# Patient Record
Sex: Male | Born: 1965 | Race: White | Hispanic: No | Marital: Single | Smoking: Former smoker
Health system: Southern US, Community
[De-identification: ages and names within clinical notes are randomized; demographics above are authoritative.]

## PROBLEM LIST (undated history)

## (undated) DIAGNOSIS — K760 Fatty (change of) liver, not elsewhere classified: Secondary | ICD-10-CM

## (undated) DIAGNOSIS — I77810 Thoracic aortic ectasia: Secondary | ICD-10-CM

## (undated) DIAGNOSIS — G8929 Other chronic pain: Secondary | ICD-10-CM

## (undated) DIAGNOSIS — M1990 Unspecified osteoarthritis, unspecified site: Secondary | ICD-10-CM

## (undated) DIAGNOSIS — K5792 Diverticulitis of intestine, part unspecified, without perforation or abscess without bleeding: Secondary | ICD-10-CM

## (undated) DIAGNOSIS — I10 Essential (primary) hypertension: Secondary | ICD-10-CM

## (undated) DIAGNOSIS — E119 Type 2 diabetes mellitus without complications: Secondary | ICD-10-CM

## (undated) DIAGNOSIS — J449 Chronic obstructive pulmonary disease, unspecified: Secondary | ICD-10-CM

## (undated) HISTORY — DX: Other chronic pain: G89.29

## (undated) HISTORY — DX: Fatty (change of) liver, not elsewhere classified: K76.0

## (undated) HISTORY — PX: NASAL SINUS SURGERY: SHX719

## (undated) HISTORY — PX: BACK SURGERY: SHX140

## (undated) HISTORY — PX: SHOULDER SURGERY: SHX246

## (undated) HISTORY — DX: Type 2 diabetes mellitus without complications: E11.9

## (undated) HISTORY — DX: Thoracic aortic ectasia: I77.810

## (undated) HISTORY — PX: CHOLECYSTECTOMY: SHX55

## (undated) HISTORY — PX: APPENDECTOMY: SHX54

---

## 2013-02-14 DIAGNOSIS — M1711 Unilateral primary osteoarthritis, right knee: Secondary | ICD-10-CM | POA: Insufficient documentation

## 2013-02-14 DIAGNOSIS — G8929 Other chronic pain: Secondary | ICD-10-CM | POA: Insufficient documentation

## 2013-02-14 DIAGNOSIS — K219 Gastro-esophageal reflux disease without esophagitis: Secondary | ICD-10-CM

## 2013-02-14 HISTORY — DX: Unilateral primary osteoarthritis, right knee: M17.11

## 2013-02-14 HISTORY — DX: Gastro-esophageal reflux disease without esophagitis: K21.9

## 2013-05-02 DIAGNOSIS — Z8249 Family history of ischemic heart disease and other diseases of the circulatory system: Secondary | ICD-10-CM

## 2013-05-02 DIAGNOSIS — I119 Hypertensive heart disease without heart failure: Secondary | ICD-10-CM

## 2013-05-02 DIAGNOSIS — Z87891 Personal history of nicotine dependence: Secondary | ICD-10-CM | POA: Insufficient documentation

## 2013-05-02 HISTORY — DX: Family history of ischemic heart disease and other diseases of the circulatory system: Z82.49

## 2013-05-02 HISTORY — DX: Hypertensive heart disease without heart failure: I11.9

## 2014-01-20 DIAGNOSIS — R0789 Other chest pain: Secondary | ICD-10-CM | POA: Diagnosis present

## 2014-01-20 HISTORY — DX: Other chest pain: R07.89

## 2014-03-05 DIAGNOSIS — K5903 Drug induced constipation: Secondary | ICD-10-CM

## 2014-03-05 HISTORY — DX: Drug induced constipation: K59.03

## 2016-05-25 ENCOUNTER — Encounter: Payer: Self-pay | Admitting: Emergency Medicine

## 2016-05-25 ENCOUNTER — Emergency Department
Admission: EM | Admit: 2016-05-25 | Discharge: 2016-05-26 | Disposition: A | Discharge status: Home / Self Care | Payer: Medicaid Other | Attending: Emergency Medicine | Admitting: Emergency Medicine

## 2016-05-25 DIAGNOSIS — R1032 Left lower quadrant pain: Secondary | ICD-10-CM | POA: Insufficient documentation

## 2016-05-25 DIAGNOSIS — Z9049 Acquired absence of other specified parts of digestive tract: Secondary | ICD-10-CM | POA: Insufficient documentation

## 2016-05-25 DIAGNOSIS — J449 Chronic obstructive pulmonary disease, unspecified: Secondary | ICD-10-CM | POA: Insufficient documentation

## 2016-05-25 DIAGNOSIS — Z87891 Personal history of nicotine dependence: Secondary | ICD-10-CM | POA: Diagnosis not present

## 2016-05-25 DIAGNOSIS — Z5321 Procedure and treatment not carried out due to patient leaving prior to being seen by health care provider: Secondary | ICD-10-CM | POA: Diagnosis not present

## 2016-05-25 DIAGNOSIS — I10 Essential (primary) hypertension: Secondary | ICD-10-CM | POA: Diagnosis not present

## 2016-05-25 HISTORY — DX: Essential (primary) hypertension: I10

## 2016-05-25 HISTORY — DX: Unspecified osteoarthritis, unspecified site: M19.90

## 2016-05-25 HISTORY — DX: Diverticulitis of intestine, part unspecified, without perforation or abscess without bleeding: K57.92

## 2016-05-25 LAB — URINALYSIS COMPLETE WITH MICROSCOPIC (ARMC ONLY)
BACTERIA UA: NONE SEEN
BILIRUBIN URINE: NEGATIVE
GLUCOSE, UA: NEGATIVE mg/dL
Ketones, ur: NEGATIVE mg/dL
Leukocytes, UA: NEGATIVE
NITRITE: NEGATIVE
Protein, ur: NEGATIVE mg/dL
SQUAMOUS EPITHELIAL / LPF: NONE SEEN
Specific Gravity, Urine: 1.024 (ref 1.005–1.030)
pH: 5 (ref 5.0–8.0)

## 2016-05-25 LAB — COMPREHENSIVE METABOLIC PANEL
ALBUMIN: 3.9 g/dL (ref 3.5–5.0)
ALK PHOS: 74 U/L (ref 38–126)
ALT: 13 U/L — ABNORMAL LOW (ref 17–63)
ANION GAP: 8 (ref 5–15)
AST: 20 U/L (ref 15–41)
BUN: 18 mg/dL (ref 6–20)
CALCIUM: 9 mg/dL (ref 8.9–10.3)
CO2: 25 mmol/L (ref 22–32)
Chloride: 101 mmol/L (ref 101–111)
Creatinine, Ser: 0.93 mg/dL (ref 0.61–1.24)
GFR calc Af Amer: >60 mL/min (ref >60)
GFR calc non Af Amer: >60 mL/min (ref >60)
GLUCOSE: 159 mg/dL — AB (ref 65–99)
POTASSIUM: 4.1 mmol/L (ref 3.5–5.1)
SODIUM: 134 mmol/L — AB (ref 135–145)
TOTAL PROTEIN: 7.6 g/dL (ref 6.5–8.1)
Total Bilirubin: 0.5 mg/dL (ref 0.3–1.2)

## 2016-05-25 LAB — CBC
HEMATOCRIT: 47.4 % (ref 40.0–52.0)
HEMOGLOBIN: 15.9 g/dL (ref 13.0–18.0)
MCH: 28 pg (ref 26.0–34.0)
MCHC: 33.5 g/dL (ref 32.0–36.0)
MCV: 83.6 fL (ref 80.0–100.0)
Platelets: 307 10*3/uL (ref 150–440)
RBC: 5.67 MIL/uL (ref 4.40–5.90)
RDW: 15.4 % — AB (ref 11.5–14.5)
WBC: 14.3 10*3/uL — ABNORMAL HIGH (ref 3.8–10.6)

## 2016-05-25 LAB — LIPASE, BLOOD: Lipase: 29 U/L (ref 11–51)

## 2016-05-25 NOTE — ED Notes (Signed)
No answer when called to room

## 2016-05-25 NOTE — ED Triage Notes (Signed)
Pt ambulatory to triage with steady gait with c/o LLQ pain for "more than a month." Pt denies urinary symptoms, reports last BM was this morning. Pt reports nausea and intermittent vomiting. Pt states "when I burp I taste poop." Pt reports gaining 8 lbs from Friday to today, reports unable to eat much food. Pt alert and oriented x 4, no increased work in breathing noted.

## 2016-05-26 ENCOUNTER — Emergency Department: Payer: Medicaid Other

## 2016-05-26 ENCOUNTER — Emergency Department
Admission: EM | Admit: 2016-05-26 | Discharge: 2016-05-26 | Disposition: A | Discharge status: Home / Self Care | Payer: Medicaid Other | Attending: Emergency Medicine | Admitting: Emergency Medicine

## 2016-05-26 ENCOUNTER — Encounter: Payer: Self-pay | Admitting: Emergency Medicine

## 2016-05-26 DIAGNOSIS — F17290 Nicotine dependence, other tobacco product, uncomplicated: Secondary | ICD-10-CM | POA: Diagnosis not present

## 2016-05-26 DIAGNOSIS — R110 Nausea: Secondary | ICD-10-CM | POA: Insufficient documentation

## 2016-05-26 DIAGNOSIS — R1032 Left lower quadrant pain: Secondary | ICD-10-CM | POA: Insufficient documentation

## 2016-05-26 DIAGNOSIS — J449 Chronic obstructive pulmonary disease, unspecified: Secondary | ICD-10-CM | POA: Diagnosis not present

## 2016-05-26 DIAGNOSIS — I10 Essential (primary) hypertension: Secondary | ICD-10-CM | POA: Diagnosis not present

## 2016-05-26 HISTORY — DX: Chronic obstructive pulmonary disease, unspecified: J44.9

## 2016-05-26 MED ORDER — ONDANSETRON HCL 4 MG/2ML IJ SOLN
4.0000 mg | Freq: Once | INTRAMUSCULAR | Status: AC
  Administered 2016-05-26: 4 mg via INTRAVENOUS

## 2016-05-26 MED ORDER — CIPROFLOXACIN HCL 500 MG PO TABS
500.0000 mg | ORAL_TABLET | Freq: Once | ORAL | Status: AC
  Administered 2016-05-26: 500 mg via ORAL
  Filled 2016-05-26: qty 1

## 2016-05-26 MED ORDER — FENTANYL CITRATE (PF) 100 MCG/2ML IJ SOLN
INTRAMUSCULAR | Status: AC
  Administered 2016-05-26: 100 ug via INTRAVENOUS
  Filled 2016-05-26: qty 2

## 2016-05-26 MED ORDER — METRONIDAZOLE 500 MG PO TABS: 500.0000 mg | ORAL_TABLET | Freq: Three times a day (TID) | ORAL | 0 refills | Status: AC

## 2016-05-26 MED ORDER — DICYCLOMINE HCL 10 MG PO CAPS: 10.0000 mg | ORAL_CAPSULE | Freq: Four times a day (QID) | ORAL | 0 refills | Status: DC | PRN

## 2016-05-26 MED ORDER — ONDANSETRON HCL 4 MG/2ML IJ SOLN
INTRAMUSCULAR | Status: AC
  Administered 2016-05-26: 4 mg via INTRAVENOUS
  Filled 2016-05-26: qty 2

## 2016-05-26 MED ORDER — CIPROFLOXACIN HCL 500 MG PO TABS: 500.0000 mg | ORAL_TABLET | Freq: Two times a day (BID) | ORAL | 0 refills | Status: AC

## 2016-05-26 MED ORDER — DICYCLOMINE HCL 10 MG PO CAPS
10.0000 mg | ORAL_CAPSULE | Freq: Once | ORAL | Status: AC
  Administered 2016-05-26: 10 mg via ORAL
  Filled 2016-05-26: qty 1

## 2016-05-26 MED ORDER — FENTANYL CITRATE (PF) 100 MCG/2ML IJ SOLN
100.0000 ug | Freq: Once | INTRAMUSCULAR | Status: AC
  Administered 2016-05-26: 100 ug via INTRAVENOUS

## 2016-05-26 MED ORDER — IOPAMIDOL (ISOVUE-370) INJECTION 76%
100.0000 mL | Freq: Once | INTRAVENOUS | Status: AC | PRN
  Administered 2016-05-26: 100 mL via INTRAVENOUS
  Filled 2016-05-26: qty 100

## 2016-05-26 MED ORDER — SODIUM CHLORIDE 0.9 % IV BOLUS (SEPSIS)
1000.0000 mL | Freq: Once | INTRAVENOUS | Status: AC
  Administered 2016-05-26: 1000 mL via INTRAVENOUS

## 2016-05-26 MED ORDER — DIATRIZOATE MEGLUMINE & SODIUM 66-10 % PO SOLN
15.0000 mL | Freq: Once | ORAL | Status: AC
  Administered 2016-05-26: 15 mL via ORAL

## 2016-05-26 MED ORDER — MORPHINE SULFATE (PF) 4 MG/ML IV SOLN
4.0000 mg | Freq: Once | INTRAVENOUS | Status: DC
  Filled 2016-05-26: qty 1

## 2016-05-26 MED ORDER — METRONIDAZOLE 500 MG PO TABS
500.0000 mg | ORAL_TABLET | Freq: Once | ORAL | Status: AC
  Administered 2016-05-26: 500 mg via ORAL
  Filled 2016-05-26: qty 1

## 2016-05-26 NOTE — ED Notes (Signed)
No answer when called from lobby 

## 2016-05-26 NOTE — Discharge Instructions (Signed)
Please seek medical attention for any high fevers, chest pain, shortness of breath, change in behavior, persistent vomiting, bloody stool or any other new or concerning symptoms.  

## 2016-05-26 NOTE — ED Notes (Signed)
States he developed some left lower abd pain about  Month ago  States pain is daily but changes in intensity    Then noticed some swelling to abd and bothm egs 2 days ago

## 2016-05-26 NOTE — ED Provider Notes (Signed)
[facility] Emergency Department Provider Note    ____________________________________________   I have reviewed the triage vital signs and the nursing notes.   HISTORY  Chief Complaint Abdominal Pain   History limited by: Not Limited   HPI Micheal Bowman is a 50 y.o. male who presents to the emergency department today because of concerns for left lower quadrant abdominal pain. The patient states pain is been there for the past month. It has been fairly constant. He describes it as a ripping sensation. Has progressively gotten worse. It is now severe. He has not noticed any change in defecation although he has started to feel nauseous with eating. Sitting upright makes the pain worse. He denies similar pain in the past. Does have a history of cholecystectomy and appendectomy. He denies any fevers although he feels like he has had chills.    Past Medical History:  Diagnosis Date  . Arthritis   . COPD (chronic obstructive pulmonary disease) (HCC)   . Diverticulitis   . Hypertension     There are no active problems to display for this patient.   Past Surgical History:  Procedure Laterality Date  . APPENDECTOMY    . BACK SURGERY    . CHOLECYSTECTOMY    . SHOULDER SURGERY Right     Prior to Admission medications   Not on File    Allergies Tylenol [acetaminophen]  History reviewed. No pertinent family history.  Social History Social History  Substance Use Topics  . Smoking status: Former Games developermoker  . Smokeless tobacco: Current User    Types: Snuff  . Alcohol use Yes     Comment: occasional    Review of Systems  Constitutional: Negative for fever. Cardiovascular: Negative for chest pain. Respiratory: Negative for shortness of breath. Gastrointestinal: Positive for left lower quadrant pain and nausea.  Neurological: Negative for headaches, focal weakness or numbness.  10-point ROS otherwise  negative.  ____________________________________________   PHYSICAL EXAM:  VITAL SIGNS: ED Triage Vitals [05/26/16 1419]  Enc Vitals Group     BP 117/63     Pulse Rate 87     Resp 20     Temp 98.3 F (36.8 C)     Temp Source Oral     SpO2 95 %     Weight (!) 328 lb (148.8 kg)     Height 6' (1.829 m)     Head Circumference      Peak Flow      Pain Score 10   Constitutional: Alert and oriented. Well appearing and in no distress. Eyes: Conjunctivae are normal. PERRL. Normal extraocular movements. ENT   Head: Normocephalic and atraumatic.   Nose: No congestion/rhinnorhea.   Mouth/Throat: Mucous membranes are moist.   Neck: No stridor. Hematological/Lymphatic/Immunilogical: No cervical lymphadenopathy. Cardiovascular: Normal rate, regular rhythm.  No murmurs, rubs, or gallops. Respiratory: Normal respiratory effort without tachypnea nor retractions. Breath sounds are clear and equal bilaterally. No wheezes/rales/rhonchi. Gastrointestinal: Soft and tender to palpation in the left lower quadrant. Genitourinary: Deferred Musculoskeletal: Normal range of motion in all extremities. No joint effusions.  No lower extremity tenderness nor edema. Neurologic:  Normal speech and language. No gross focal neurologic deficits are appreciated.  Skin:  Skin is warm, dry and intact. No rash noted. Psychiatric: Mood and affect are normal. Speech and behavior are normal. Patient exhibits appropriate insight and judgment.  ____________________________________________    LABS (pertinent positives/negatives)  Labs Reviewed - No data to display   ____________________________________________   EKG  None  ____________________________________________    RADIOLOGY  CT abd/pel IMPRESSION:  1. No acute findings within the abdomen pelvis. No findings to  explain left lower quadrant pain for 1 month.  2. There are colonic diverticula but no evidence of diverticulitis.  3.  Hepatic steatosis.       ____________________________________________   PROCEDURES  Procedures  ____________________________________________   INITIAL IMPRESSION / ASSESSMENT AND PLAN / ED COURSE  Pertinent labs & imaging results that were available during my care of the patient were reviewed by me and considered in my medical decision making (see chart for details).  Patient presents to the emergency department today because of concerns for left lower quadrant pain. On exam he is tender to palpation left lower quadrant. Will obtain CT scan.  Clinical Course   CT scan shows diverticulosis without any acute findings to explain the patient's pain. However given history of pain I would still consider diverticulitis. WIll plan on discharging home with antibiotics and pcp follow up information. ____________________________________________   FINAL CLINICAL IMPRESSION(S) / ED DIAGNOSES  Final diagnoses:  Left lower quadrant pain     Note: This dictation was prepared with Dragon dictation. Any transcriptional errors that result from this process are unintentional    Phineas SemenGraydon Jane Broughton, MD 05/26/16 [phone]

## 2016-05-26 NOTE — ED Triage Notes (Signed)
Pt reports that he has had LLQ pain x 1 month that has gotten worse and worse. Pt states he came yesterday but LWBS due to the wait. Pt states he has constant gas and that he has been bloated in last few days. Pt states that it feels like he "is ripped open." Pt reports reduced appetite. NAD Noted.

## 2016-05-26 NOTE — ED Notes (Signed)
Discharge instructions reviewed with patient. Patient verbalized understanding. Patient ambulated to lobby without difficulty.   

## 2016-07-06 ENCOUNTER — Ambulatory Visit (INDEPENDENT_AMBULATORY_CARE_PROVIDER_SITE_OTHER): Payer: Medicaid Other | Admitting: Neurology

## 2016-07-06 ENCOUNTER — Encounter: Payer: Self-pay | Admitting: Neurology

## 2016-07-06 VITALS — BP 110/72 | HR 69 | Ht 72.0 in | Wt 325.0 lb

## 2016-07-06 DIAGNOSIS — G250 Essential tremor: Secondary | ICD-10-CM

## 2016-07-06 DIAGNOSIS — L989 Disorder of the skin and subcutaneous tissue, unspecified: Secondary | ICD-10-CM | POA: Diagnosis not present

## 2016-07-06 HISTORY — DX: Essential tremor: G25.0

## 2016-07-06 MED ORDER — PRIMIDONE 50 MG PO TABS: ORAL_TABLET | ORAL | 5 refills | Status: DC

## 2016-07-06 NOTE — Patient Instructions (Addendum)
1.  Start primidone 50mg  tablet  - take half-tablet at bedtime for one week, then increase to 1 tablet at bedtime 2.  Call with an update in 1 month 3.  Follow-up with primary care doctor about skin lesion on the leg  Return to clinic 4 months

## 2016-07-06 NOTE — Progress Notes (Signed)
[facility] Neu[facility] Clinic Note - Initial Visit   Date: 07/06/16  Zebulin Siegel MRN: [id number] DOB: [birth year]   Dear Micheal Docker, NP:   Thank you for your kind referral of Micheal Bowman for consultation of tremors. Although tremors history is well known to you, please allow [place] to reiterate it for the purpose of our medical record. The patient was accompanied to the clinic by fiance who also provides collateral information.     History of Present Illness: Micheal Bowman is a 50 y.o. right-handed Caucasian male with hypertension, COPD, GERD, former smoker, s/p posterior lumbar interbody fusion (PLIF) chronic pain, and RLS presenting for evaluation of tremors.    Starting around 2012, he began noticing tremors of the hands which were worse when doing activities.  Symptoms are intermittent, but became more intense and involving greater part of his body over the past few years.  Tremors are most noticeable when he is trying to do tasks. He states that the tremors interfere with his usual activities, for instance, he was placing something on his fiance's motorcycle and it took him 2-days to do, which otherwise would have taken much less time. Tremors are unchanged with alcohol and he does not consume caffeine.  There is no family history of tremor.   He states that his mother head a head tremor at rest and was diagnosed with parkinson's disease.  Patient reports he is walking much slower because of low back pain and knee pain.  There is no stiffness of the arms.    He also complains of a new right lower leg skin lesion which came up two weeks ago.  He denies any bug bites or trauma over the region.  Kathrin Penner feels that it is changing in shape and color.  Out-side paper records, electronic medical record, and images have been reviewed where available and summarized as:  Lab Results  Component Value Date   NA 134 (L) 05/25/2016   K 4.1 05/25/2016   CL 101 05/25/2016   CO2  25 05/25/2016   Lab Results  Component Value Date   WBC 14.3 (H) 05/25/2016   HGB 15.9 05/25/2016   HCT 47.4 05/25/2016   MCV 83.6 05/25/2016   PLT 307 05/25/2016   Lab Results  Component Value Date   ALT 13 (L) 05/25/2016   AST 20 05/25/2016   ALKPHOS 74 05/25/2016   BILITOT 0.5 05/25/2016     Past Medical History:  Diagnosis Date  . Arthritis   . Chronic pain   . COPD (chronic obstructive pulmonary disease) (HCC)   . Diverticulitis   . Fatty liver   . Hypertension     Past Surgical History:  Procedure Laterality Date  . APPENDECTOMY    . BACK SURGERY    . CHOLECYSTECTOMY    . NASAL SINUS SURGERY    . SHOULDER SURGERY Right      Medications:  Outpatient Encounter Prescriptions as of 07/06/2016  Medication Sig Note  . aspirin EC 81 MG tablet Take 81 mg by mouth. 07/06/2016: Received from: [facility] Received Sig: Take 81 mg by mouth daily.  . Cholecalciferol (VITAMIN D3) 5000 units TABS Take by mouth. 07/06/2016: Received from: [facility] Received Sig: Take 5,000 Units by mouth daily.  . citalopram (CELEXA) 20 MG tablet Take 20 mg by mouth daily. 07/06/2016: Received from: External Pharmacy Received Sig: TAKE 1 TABLET BY MOUTH EVERY DAY  . docusate sodium (COLACE) 100 MG capsule Take 100 mg by mouth.  07/06/2016: Received from: [facility] Received Sig: Take 100 mg by mouth every other day.  . fluticasone (FLONASE) 50 MCG/ACT nasal spray Place 2 sprays into both nostrils daily. 07/06/2016: Received from: External Pharmacy Received Sig: INHALE 2 SPRAYS IN EACH NOSTRIL DAILY  . lisinopril-hydrochlorothiazide (PRINZIDE,ZESTORETIC) 10-12.5 MG tablet Take 1 tablet by mouth daily. 07/06/2016: Received from: External Pharmacy Received Sig: TAKE 1 TABLET BY MOUTH EVERY MORNING FOR BLOOD PRESSURE  . meloxicam (MOBIC) 15 MG tablet Take 15 mg by mouth daily. 07/06/2016: Received from: External Pharmacy Received Sig: TAKE 1 TABLET BY MOUTH EVERY DAY  . metoprolol succinate  (TOPROL-XL) 25 MG 24 hr tablet Take 25 mg by mouth daily. 07/06/2016: Received from: External Pharmacy Received Sig: TAKE 1 TABLET BY MOUTH EVERY DAY  . omeprazole (PRILOSEC) 40 MG capsule Take 40 mg by mouth daily. 07/06/2016: Received from: External Pharmacy Received Sig: TAKE 1 CAPSULE BY MOUTH ONCE A DAY  . Oxycodone HCl 10 MG TABS Take 10 mg by mouth 4 (four) times daily. 07/06/2016: Received from: [facility] Received Sig: Take 10 mg by mouth every six (6) hours as needed for pain.  Marland Kitchen rOPINIRole (REQUIP) 1 MG tablet Take 1 mg by mouth at bedtime. 07/06/2016: Received from: External Pharmacy Received Sig: TAKE 1 TABLET BY MOUTH AT BEDTIME  . SPIRIVA RESPIMAT 1.25 MCG/ACT AERS INHALE 2 PUFFS BY MOUTH DAILY 07/06/2016: Received from: External Pharmacy  . tiZANidine (ZANAFLEX) 4 MG tablet Take 1 tablet by mouth daily. 07/06/2016: Received from: External Pharmacy Received Sig: TAKE 1 TABLET BY MOUTH 3 TIMES A DAY AS NEEDED FOR BACK SPASMS  . [DISCONTINUED] dicyclomine (BENTYL) 10 MG capsule Take 1 capsule (10 mg total) by mouth 4 (four) times daily as needed (abdominal pain).    No facility-administered encounter medications on file as of 07/06/2016.      Allergies:  Allergies  Allergen Reactions  . Gabapentin Other (See Comments)    Sour stomach, diarreha  . Hydromorphone Other (See Comments)    headache  . Tylenol [Acetaminophen] Other (See Comments)    "makes my stomach cramp"  . Celecoxib Nausea Only  . Codeine Itching  . Ibuprofen Nausea And Vomiting and Other (See Comments)    SICK ON STOMACH, CRAMPS    Family History: Family History  Problem Relation Age of Onset  . Diabetes Mother   . Heart disease Father   . Cancer Sister   . Cancer Sister     Social History: Social History  Substance Use Topics  . Smoking status: Former Smoker    Quit date: 07/06/2014  . Smokeless tobacco: Current User    Types: Snuff  . Alcohol use Yes     Comment: beer every 3 months   Social  History   Social History Narrative  . No narrative on file    Review of Systems:  CONSTITUTIONAL: No fevers, chills, night sweats, or weight loss.   EYES: No visual changes or eye pain ENT: No hearing changes.  No history of nose bleeds.   RESPIRATORY: No cough, wheezing and shortness of breath.   CARDIOVASCULAR: Negative for chest pain, and palpitations.   GI: Negative for abdominal discomfort, blood in stools or black stools.  No recent change in bowel habits.   GU:  No history of incontinence.   MUSCLOSKELETAL: +history of joint pain or swelling.  No myalgias.   SKIN: Negative for lesions, rash, and itching.   HEMATOLOGY/ONCOLOGY: Negative for prolonged bleeding, bruising easily, and swollen nodes.  No history of cancer.   ENDOCRINE: Negative for cold or heat intolerance, polydipsia or goiter.   PSYCH:  No depression +anxiety symptoms.   NEURO: As Above.   Vital Signs:  BP 110/72   Pulse 69   Ht 6' (1.829 m)   Wt (!) 325 lb (147.4 kg)   BMI 44.08 kg/m   General Medical Exam:   General:  Well appearing, comfortable.   Eyes/ENT: see cranial nerve examination.   Neck: No masses appreciated.  Full range of motion without tenderness.  No carotid bruits. Respiratory:  Clear to auscultation, good air entry bilaterally.   Cardiac:  Regular rate and rhythm, no murmur.   Extremities:  No deformities, edema, or skin discoloration.  Skin:  Dark skin lesion over the right lower leg.  Neurological Exam: MENTAL STATUS including orientation to time, place, person, recent and remote memory, attention span and concentration, language, and fund of knowledge is normal.  Speech is not dysarthric.  CRANIAL NERVES: II:  No visual field defects.  Unremarkable fundi.   III-IV-VI: Pupils equal round and reactive to light.  Normal conjugate, extra-ocular eye movements in all directions of gaze.  No nystagmus.  No ptosis.   V:  Normal facial sensation.     VII:  Normal facial symmetry and  movements.   VIII:  Normal hearing and vestibular function.   IX-X:  Normal palatal movement.   XI:  Normal shoulder shrug and head rotation.   XII:  Normal tongue strength and range of motion, no deviation or fasciculation.  MOTOR:  No atrophy.  There is a low amplitude high frequency hand tremor especially noticeable when finger to nose testing and with arms outstretched.  No tremor at rest. No pronator drift.  Tone is normal.    Right Upper Extremity:    Left Upper Extremity:    Deltoid  5/5   Deltoid  5/5   Biceps  5/5   Biceps  5/5   Triceps  5/5   Triceps  5/5   Wrist extensors  5/5   Wrist extensors  5/5   Wrist flexors  5/5   Wrist flexors  5/5   Finger extensors  5/5   Finger extensors  5/5   Finger flexors  5/5   Finger flexors  5/5   Dorsal interossei  5/5   Dorsal interossei  5/5   Abductor pollicis  5/5   Abductor pollicis  5/5   Tone (Ashworth scale)  0  Tone (Ashworth scale)  0   Right Lower Extremity:    Left Lower Extremity:    Hip flexors  5/5   Hip flexors  5/5   Hip extensors  5/5   Hip extensors  5/5   Knee flexors  5/5   Knee flexors  5/5   Knee extensors  5/5   Knee extensors  5/5   Dorsiflexors  5/5   Dorsiflexors  5/5   Plantarflexors  5/5   Plantarflexors  5/5   Toe extensors  5/5   Toe extensors  5/5   Toe flexors  5/5   Toe flexors  5/5   Tone (Ashworth scale)  0  Tone (Ashworth scale)  0   MSRs:  Right  Left brachioradialis 2+  brachioradialis 2+  biceps 2+  biceps 2+  triceps 2+  triceps 2+  patellar 2+  patellar 2+  ankle jerk 1+  ankle jerk 1+  Hoffman no  Hoffman no  plantar response down  plantar response down   SENSORY:  Reduced vibration and temperature in the lower legs.  Pin prick is intact.   COORDINATION/GAIT:  No dysmetria with finger-to- nose-finger testing, but there is worsening of postural tremor.  Intact rapid alternating movements bilaterally.  Able to rise from a  chair without using arms.  Gait wide-based, slow and antalgic.     IMPRESSION: 1.  Benign essential tremor, no evidence of parkinsonian features on exam  - Start primidone 25mg  at bedtime x 1 week, then increase to 50mg  at bedtime.  Common side effects discussed, including sedation and suicidal ideation.  - With his COPD, prefer to avoid propranolol   - Patient instructed to call with update in 1 month  2.  S/p lumbar fusion and chronic low back pain, followed by PCP. 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  I do not have any imaging to review.   3.  Abnormal skin lesion of the right lower leg  - Recommend he follow-up with PCP  Return to clinic in 4 months.   The duration of this appointment visit was 45 minutes of face-to-face time with the patient.  Greater than 50% of this time was spent in counseling, explanation of diagnosis, planning of further management, and coordination of care.   Thank you for allowing me to participate in patient's care.  If I can answer any additional questions, I would be pleased to do so.    Sincerely,    Lallie Strahm K. Allena Katz, DO

## 2016-07-06 NOTE — Progress Notes (Signed)
Note sent to PCP.  

## 2016-09-02 ENCOUNTER — Telehealth: Payer: Self-pay | Admitting: *Deleted

## 2016-09-02 NOTE — Telephone Encounter (Signed)
Entered in error

## 2016-09-02 NOTE — Telephone Encounter (Signed)
Patient's mother Harriett Sine(Nancy) called stating that patient's tremors are getting bad again like they were before he started medication.  They are wondering if we need to increase medication.  Informed her that I will have to get back to her on Monday and she was fine with that.

## 2016-09-05 NOTE — Telephone Encounter (Signed)
Please instruct him to increase primidone to 75mg  daily (1.5 tab).

## 2016-09-05 NOTE — Telephone Encounter (Signed)
Attempted to call Harriett SineNancy back.  No answer and no voicemail.

## 2016-09-06 ENCOUNTER — Other Ambulatory Visit: Payer: Self-pay | Admitting: *Deleted

## 2016-09-06 MED ORDER — PRIMIDONE 50 MG PO TABS: ORAL_TABLET | ORAL | 5 refills | Status: DC

## 2016-09-06 NOTE — Telephone Encounter (Signed)
Patient's mom given the instructions.

## 2016-09-09 ENCOUNTER — Other Ambulatory Visit: Payer: Self-pay | Admitting: *Deleted

## 2016-11-07 ENCOUNTER — Ambulatory Visit: Payer: Medicaid Other | Admitting: Neurology

## 2016-11-21 ENCOUNTER — Ambulatory Visit: Payer: Medicaid Other | Admitting: Neurology

## 2016-11-30 ENCOUNTER — Encounter: Payer: Self-pay | Admitting: Neurology

## 2017-03-05 ENCOUNTER — Other Ambulatory Visit: Payer: Self-pay | Admitting: Neurology

## 2017-03-23 ENCOUNTER — Encounter: Payer: Self-pay | Admitting: Emergency Medicine

## 2017-03-23 ENCOUNTER — Other Ambulatory Visit: Payer: Self-pay

## 2017-03-23 ENCOUNTER — Observation Stay
Admission: EM | Admit: 2017-03-23 | Discharge: 2017-03-24 | Disposition: A | Discharge status: Home / Self Care | Payer: Medicaid Other | Attending: Internal Medicine | Admitting: Internal Medicine

## 2017-03-23 ENCOUNTER — Emergency Department: Payer: Medicaid Other

## 2017-03-23 DIAGNOSIS — G4730 Sleep apnea, unspecified: Secondary | ICD-10-CM | POA: Insufficient documentation

## 2017-03-23 DIAGNOSIS — Z6841 Body Mass Index (BMI) 40.0 and over, adult: Secondary | ICD-10-CM | POA: Diagnosis not present

## 2017-03-23 DIAGNOSIS — K760 Fatty (change of) liver, not elsewhere classified: Secondary | ICD-10-CM | POA: Insufficient documentation

## 2017-03-23 DIAGNOSIS — Z7982 Long term (current) use of aspirin: Secondary | ICD-10-CM | POA: Insufficient documentation

## 2017-03-23 DIAGNOSIS — G2581 Restless legs syndrome: Secondary | ICD-10-CM | POA: Insufficient documentation

## 2017-03-23 DIAGNOSIS — R079 Chest pain, unspecified: Secondary | ICD-10-CM

## 2017-03-23 DIAGNOSIS — E669 Obesity, unspecified: Secondary | ICD-10-CM | POA: Insufficient documentation

## 2017-03-23 DIAGNOSIS — Z87891 Personal history of nicotine dependence: Secondary | ICD-10-CM | POA: Insufficient documentation

## 2017-03-23 DIAGNOSIS — I119 Hypertensive heart disease without heart failure: Secondary | ICD-10-CM | POA: Diagnosis not present

## 2017-03-23 DIAGNOSIS — R0789 Other chest pain: Secondary | ICD-10-CM | POA: Diagnosis not present

## 2017-03-23 DIAGNOSIS — G8929 Other chronic pain: Secondary | ICD-10-CM | POA: Insufficient documentation

## 2017-03-23 DIAGNOSIS — N400 Enlarged prostate without lower urinary tract symptoms: Secondary | ICD-10-CM | POA: Insufficient documentation

## 2017-03-23 DIAGNOSIS — Z79899 Other long term (current) drug therapy: Secondary | ICD-10-CM | POA: Insufficient documentation

## 2017-03-23 DIAGNOSIS — J449 Chronic obstructive pulmonary disease, unspecified: Secondary | ICD-10-CM | POA: Diagnosis not present

## 2017-03-23 LAB — CBC
HCT: 44.1 % (ref 40.0–52.0)
HEMATOCRIT: 43.8 % (ref 40.0–52.0)
HEMOGLOBIN: 14.4 g/dL (ref 13.0–18.0)
Hemoglobin: 14.5 g/dL (ref 13.0–18.0)
MCH: 27.4 pg (ref 26.0–34.0)
MCH: 27.9 pg (ref 26.0–34.0)
MCHC: 32.9 g/dL (ref 32.0–36.0)
MCHC: 32.8 g/dL (ref 32.0–36.0)
MCV: 83.5 fL (ref 80.0–100.0)
MCV: 85 fL (ref 80.0–100.0)
PLATELETS: 327 10*3/uL (ref 150–440)
Platelets: 293 10*3/uL (ref 150–440)
RBC: 5.28 MIL/uL (ref 4.40–5.90)
RBC: 5.15 MIL/uL (ref 4.40–5.90)
RDW: 15.8 % — AB (ref 11.5–14.5)
RDW: 16.1 % — ABNORMAL HIGH (ref 11.5–14.5)
WBC: 11.2 10*3/uL — ABNORMAL HIGH (ref 3.8–10.6)
WBC: 12.5 10*3/uL — ABNORMAL HIGH (ref 3.8–10.6)

## 2017-03-23 LAB — BASIC METABOLIC PANEL
Anion gap: 5 (ref 5–15)
BUN: 19 mg/dL (ref 6–20)
CALCIUM: 9.1 mg/dL (ref 8.9–10.3)
CHLORIDE: 99 mmol/L — AB (ref 101–111)
CO2: 31 mmol/L (ref 22–32)
CREATININE: 1.04 mg/dL (ref 0.61–1.24)
GFR calc Af Amer: >60 mL/min (ref >60)
GFR calc non Af Amer: >60 mL/min (ref >60)
GLUCOSE: 138 mg/dL — AB (ref 65–99)
Potassium: 4.4 mmol/L (ref 3.5–5.1)
Sodium: 135 mmol/L (ref 135–145)

## 2017-03-23 LAB — TROPONIN I: Troponin I: <0.03 ng/mL (ref <0.03)

## 2017-03-23 LAB — CREATININE, SERUM
Creatinine, Ser: 0.97 mg/dL (ref 0.61–1.24)
GFR calc Af Amer: >60 mL/min (ref >60)
GFR calc non Af Amer: >60 mL/min (ref >60)

## 2017-03-23 MED ORDER — QUETIAPINE FUMARATE 200 MG PO TABS
200.0000 mg | ORAL_TABLET | Freq: Every day | ORAL | Status: DC
  Administered 2017-03-23: 200 mg via ORAL
  Filled 2017-03-23 (×2): qty 1

## 2017-03-23 MED ORDER — ROPINIROLE HCL 1 MG PO TABS
1.0000 mg | ORAL_TABLET | Freq: Every day | ORAL | Status: DC
  Administered 2017-03-23: 1 mg via ORAL
  Filled 2017-03-23: qty 1

## 2017-03-23 MED ORDER — TRAZODONE HCL 100 MG PO TABS
100.0000 mg | ORAL_TABLET | Freq: Every evening | ORAL | Status: DC | PRN
  Administered 2017-03-23: 100 mg via ORAL
  Filled 2017-03-23: qty 1

## 2017-03-23 MED ORDER — NITROGLYCERIN 2 % TD OINT
1.0000 [in_us] | TOPICAL_OINTMENT | Freq: Once | TRANSDERMAL | Status: AC
  Administered 2017-03-23: 1 [in_us] via TOPICAL
  Filled 2017-03-23: qty 1

## 2017-03-23 MED ORDER — PANTOPRAZOLE SODIUM 40 MG PO TBEC
40.0000 mg | DELAYED_RELEASE_TABLET | Freq: Every day | ORAL | Status: DC
  Administered 2017-03-23 – 2017-03-24 (×2): 40 mg via ORAL
  Filled 2017-03-23 (×2): qty 1

## 2017-03-23 MED ORDER — HEPARIN SODIUM (PORCINE) 5000 UNIT/ML IJ SOLN
5000.0000 [IU] | Freq: Three times a day (TID) | INTRAMUSCULAR | Status: DC
  Administered 2017-03-23 – 2017-03-24 (×2): 5000 [IU] via SUBCUTANEOUS
  Filled 2017-03-23 (×2): qty 1

## 2017-03-23 MED ORDER — ACETAMINOPHEN 650 MG RE SUPP: 650.0000 mg | Freq: Four times a day (QID) | RECTAL | Status: DC | PRN

## 2017-03-23 MED ORDER — ONDANSETRON HCL 4 MG/2ML IJ SOLN
INTRAMUSCULAR | Status: AC
  Administered 2017-03-23: 4 mg via INTRAVENOUS
  Filled 2017-03-23: qty 2

## 2017-03-23 MED ORDER — ASPIRIN 81 MG PO CHEW
243.0000 mg | CHEWABLE_TABLET | Freq: Once | ORAL | Status: AC
  Administered 2017-03-23: 243 mg via ORAL
  Filled 2017-03-23: qty 3

## 2017-03-23 MED ORDER — LISINOPRIL 10 MG PO TABS
10.0000 mg | ORAL_TABLET | Freq: Every day | ORAL | Status: DC
  Administered 2017-03-24: 10 mg via ORAL
  Filled 2017-03-23: qty 1

## 2017-03-23 MED ORDER — ONDANSETRON HCL 4 MG/2ML IJ SOLN: 4.0000 mg | Freq: Four times a day (QID) | INTRAMUSCULAR | Status: DC | PRN

## 2017-03-23 MED ORDER — ASPIRIN EC 81 MG PO TBEC
81.0000 mg | DELAYED_RELEASE_TABLET | Freq: Every day | ORAL | Status: DC
  Administered 2017-03-24: 81 mg via ORAL
  Filled 2017-03-23: qty 1

## 2017-03-23 MED ORDER — CITALOPRAM HYDROBROMIDE 20 MG PO TABS
40.0000 mg | ORAL_TABLET | Freq: Every day | ORAL | Status: DC
  Administered 2017-03-23 – 2017-03-24 (×2): 40 mg via ORAL
  Filled 2017-03-23 (×2): qty 2

## 2017-03-23 MED ORDER — NITROGLYCERIN 0.4 MG SL SUBL: 0.4000 mg | SUBLINGUAL_TABLET | SUBLINGUAL | Status: DC | PRN

## 2017-03-23 MED ORDER — HYDROCHLOROTHIAZIDE 12.5 MG PO CAPS
12.5000 mg | ORAL_CAPSULE | Freq: Every day | ORAL | Status: DC
  Administered 2017-03-24: 12.5 mg via ORAL
  Filled 2017-03-23: qty 1

## 2017-03-23 MED ORDER — OXYCODONE HCL 5 MG PO TABS
10.0000 mg | ORAL_TABLET | Freq: Four times a day (QID) | ORAL | Status: DC | PRN
  Administered 2017-03-23 – 2017-03-24 (×3): 10 mg via ORAL
  Filled 2017-03-23 (×3): qty 2

## 2017-03-23 MED ORDER — MORPHINE SULFATE (PF) 4 MG/ML IV SOLN
INTRAVENOUS | Status: AC
  Administered 2017-03-23: 4 mg via INTRAVENOUS
  Filled 2017-03-23: qty 1

## 2017-03-23 MED ORDER — ONDANSETRON HCL 4 MG/2ML IJ SOLN
4.0000 mg | Freq: Once | INTRAMUSCULAR | Status: AC
  Administered 2017-03-23: 4 mg via INTRAVENOUS

## 2017-03-23 MED ORDER — ACETAMINOPHEN 325 MG PO TABS: 650.0000 mg | ORAL_TABLET | Freq: Four times a day (QID) | ORAL | Status: DC | PRN

## 2017-03-23 MED ORDER — METOPROLOL SUCCINATE ER 25 MG PO TB24
25.0000 mg | ORAL_TABLET | Freq: Every day | ORAL | Status: DC
  Administered 2017-03-23 – 2017-03-24 (×2): 25 mg via ORAL
  Filled 2017-03-23 (×2): qty 1

## 2017-03-23 MED ORDER — NITROGLYCERIN 0.4 MG SL SUBL
0.4000 mg | SUBLINGUAL_TABLET | SUBLINGUAL | Status: DC | PRN
  Administered 2017-03-23 (×2): 0.4 mg via SUBLINGUAL
  Filled 2017-03-23: qty 1

## 2017-03-23 MED ORDER — IOPAMIDOL (ISOVUE-370) INJECTION 76%
125.0000 mL | Freq: Once | INTRAVENOUS | Status: AC | PRN
  Administered 2017-03-23: 125 mL via INTRAVENOUS

## 2017-03-23 MED ORDER — ONDANSETRON HCL 4 MG PO TABS: 4.0000 mg | ORAL_TABLET | Freq: Four times a day (QID) | ORAL | Status: DC | PRN

## 2017-03-23 MED ORDER — FLUTICASONE PROPIONATE 50 MCG/ACT NA SUSP
2.0000 | Freq: Every day | NASAL | Status: DC
  Administered 2017-03-23: 2 via NASAL
  Filled 2017-03-23: qty 16

## 2017-03-23 MED ORDER — MORPHINE SULFATE (PF) 4 MG/ML IV SOLN
4.0000 mg | Freq: Once | INTRAVENOUS | Status: AC
Start: 2017-03-23 — End: 2017-03-23
  Administered 2017-03-23: 4 mg via INTRAVENOUS

## 2017-03-23 MED ORDER — DOCUSATE SODIUM 100 MG PO CAPS
100.0000 mg | ORAL_CAPSULE | ORAL | Status: DC
  Administered 2017-03-23: 100 mg via ORAL
  Filled 2017-03-23 (×2): qty 1

## 2017-03-23 MED ORDER — LISINOPRIL-HYDROCHLOROTHIAZIDE 10-12.5 MG PO TABS: 1.0000 | ORAL_TABLET | Freq: Every day | ORAL | Status: DC

## 2017-03-23 MED ORDER — TAMSULOSIN HCL 0.4 MG PO CAPS
0.4000 mg | ORAL_CAPSULE | Freq: Every day | ORAL | Status: DC
  Administered 2017-03-23 – 2017-03-24 (×2): 0.4 mg via ORAL
  Filled 2017-03-23 (×2): qty 1

## 2017-03-23 MED ORDER — MELOXICAM 7.5 MG PO TABS
15.0000 mg | ORAL_TABLET | Freq: Every day | ORAL | Status: DC
  Administered 2017-03-23 – 2017-03-24 (×2): 15 mg via ORAL
  Filled 2017-03-23 (×2): qty 2

## 2017-03-23 NOTE — ED Notes (Signed)
Pt called out and reports chest pain is severe,  Pt clutching left side of chest, second EKG done, dr Shaune Pollacklord notified.  Pt states chest pain lasted for about 3 minutes and then resolved on its own.

## 2017-03-23 NOTE — ED Provider Notes (Signed)
[facility] Emergency Department Provider Note ____________________________________________   I have reviewed the triage vital signs and the triage nursing note.  HISTORY  Chief Complaint Chest Pain   Historian Patient and his wife  HPI Jennings BooksJohnnie Obremski is a 51 y.o. male with a history of chronic right shoulder pain for which she takes narcotic pain medication, as well as COPD and hypertension, presents for chest discomfort that started while he was changing a battery for his wife's car on Sunday. It is located in the left anterior chest and it feels deep. It's been there essentially constantly but is worse with coughing. At times he feels like his heart is skipping a beat. He feels some pressure there. No nausea or vomiting. No extension up into the neck or jaw or arm. No known history of coronary disease. He takes a baby aspirin daily.  Does not follow with a cardiologist. Symptoms are moderate. His wife was finally able to convince him to come get some evaluation. She gave him a nitroglycerin Sunday which helped somewhat.    Past Medical History:  Diagnosis Date  . Arthritis   . Chronic pain   . COPD (chronic obstructive pulmonary disease) (HCC)   . Diverticulitis   . Fatty liver   . Hypertension     Patient Active Problem List   Diagnosis Date Noted  . Essential tremor 07/06/2016    Past Surgical History:  Procedure Laterality Date  . APPENDECTOMY    . BACK SURGERY    . CHOLECYSTECTOMY    . NASAL SINUS SURGERY    . SHOULDER SURGERY Right     Prior to Admission medications   Medication Sig Start Date End Date Taking? Authorizing Provider  aspirin EC 81 MG tablet Take 81 mg by mouth.   Yes [provider]  Cholecalciferol (VITAMIN D3) 5000 units TABS Take by mouth.   Yes [provider]  citalopram (CELEXA) 40 MG tablet Take 40 mg by mouth daily. 03/05/17  Yes [provider]  docusate sodium (COLACE) 100 MG capsule  Take 100 mg by mouth every other day.    Yes [provider]  fluticasone (FLONASE) 50 MCG/ACT nasal spray Place 2 sprays into both nostrils daily. 05/03/16  Yes [provider]  lisinopril-hydrochlorothiazide (PRINZIDE,ZESTORETIC) 10-12.5 MG tablet Take 1 tablet by mouth daily. 06/17/16  Yes [provider]  meloxicam (MOBIC) 15 MG tablet Take 15 mg by mouth daily. 06/17/16  Yes [provider]  metoprolol succinate (TOPROL-XL) 25 MG 24 hr tablet Take 25 mg by mouth daily. 06/17/16  Yes [provider]  omeprazole (PRILOSEC) 40 MG capsule Take 40 mg by mouth daily. 06/17/16  Yes [provider]  Oxycodone HCl 10 MG TABS Take 10 mg by mouth every 6 (six) hours as needed (PAIN).    Yes [provider]  QUEtiapine (SEROQUEL) 100 MG tablet Take 200 mg by mouth at bedtime.   Yes [provider]  rOPINIRole (REQUIP) 1 MG tablet Take 1 mg by mouth at bedtime. 06/17/16  Yes [provider]  SPIRIVA RESPIMAT 1.25 MCG/ACT AERS INHALE 2 PUFFS BY MOUTH DAILY 05/20/16  Yes [provider]  tamsulosin (FLOMAX) 0.4 MG CAPS capsule Take 0.4 mg by mouth daily. 03/07/17  Yes [provider]  traZODone (DESYREL) 100 MG tablet Take 100 mg by mouth at bedtime as needed for sleep. 03/06/17  Yes [provider]  primidone (MYSOLINE) 50 MG tablet Take 1 and 1/2 tablets at bedtime. (  75 mg total) Patient not taking: Reported on 03/23/2017 09/06/16   Nita Sickle K, DO    Allergies  Allergen Reactions  . Gabapentin Other (See Comments)    Sour stomach, diarreha  . Hydromorphone Other (See Comments)    headache  . Tylenol [Acetaminophen] Other (See Comments)    "makes my stomach cramp"  . Celecoxib Nausea Only  . Codeine Itching  . Ibuprofen Nausea And Vomiting and Other (See Comments)    SICK ON STOMACH, CRAMPS    Family History  Problem Relation Age of Onset  . Diabetes Mother   . Heart disease Father   . Cancer  Sister   . Cancer Sister     Social History Social History  Substance Use Topics  . Smoking status: Former Smoker    Quit date: 07/06/2014  . Smokeless tobacco: Current User    Types: Snuff  . Alcohol use Yes     Comment: beer every 3 months    Review of Systems  Constitutional: Negative for fever. Eyes: Negative for visual changes. ENT: Negative for sore throat. Cardiovascular: Positive for chest pain. Respiratory: Mild occasional cough. Gastrointestinal: Negative for abdominal pain, vomiting and diarrhea.  Denies burping or belching. Genitourinary: Negative for dysuria. Musculoskeletal: Negative for back pain. Skin: Negative for rash. Neurological: Negative for headache.  ____________________________________________   PHYSICAL EXAM:  VITAL SIGNS: ED Triage Vitals [03/23/17 0959]  Enc Vitals Group     BP 119/64     Pulse Rate 86     Resp 18     Temp 98.3 F (36.8 C)     Temp Source Oral     SpO2 94 %     Weight (!) 347 lb (157.4 kg)     Height 6' (1.829 m)     Head Circumference      Peak Flow      Pain Score 5     Pain Loc      Pain Edu?      Excl. in GC?      Constitutional: Alert and oriented. Well appearing and in no distress. HEENT   Head: Normocephalic and atraumatic.      Eyes: Conjunctivae are normal. Pupils equal and round.       Ears:         Nose: No congestion/rhinnorhea.   Mouth/Throat: Mucous membranes are moist.   Neck: No stridor. Cardiovascular/Chest: Normal rate, regular rhythm.  No murmurs, rubs, or gallops. Respiratory: Normal respiratory effort without tachypnea nor retractions. Breath sounds are clear and equal bilaterally. No wheezes/rales/rhonchi. Gastrointestinal: Soft. No distention, no guarding, no rebound. Nontender.   Genitourinary/rectal:Deferred Musculoskeletal: Nontender with normal range of motion in all extremities. No joint effusions.  No lower extremity tenderness.  No edema. Neurologic:  Normal speech and  language. No gross or focal neurologic deficits are appreciated. Skin:  Skin is warm, dry and intact. No rash noted. Psychiatric: Mood and affect are normal. Speech and behavior are normal. Patient exhibits appropriate insight and judgment.   ____________________________________________  LABS (pertinent positives/negatives)  Labs Reviewed  BASIC METABOLIC PANEL - Abnormal; Notable for the following:       Result Value   Chloride 99 (*)    Glucose, Bld 138 (*)    All other components within normal limits  CBC - Abnormal; Notable for the following:    WBC 11.2 (*)    RDW 15.8 (*)    All other components within normal limits  TROPONIN I  TROPONIN I  ____________________________________________    EKG I, Governor Rooks, MD, the attending physician have personally viewed and interpreted all ECGs.  82 bpm. Normal sinus rhythm. Narrow QRS. Normal axis. Normal ST and T-wave  EKG #2 84 bpm. Normal sinus rhythm. Narrow QRS normal axis. T waves inverted in 3 and aVF which didn't change from earlier EKG. ____________________________________________  RADIOLOGY All Xrays were viewed by me. Imaging interpreted by Radiologist.  Chest x-ray two-view: IMPRESSION: 1.  Borderline cardiomegaly.  No pulmonary venous congestion.  2. No acute pulmonary disease.  CT anterior chest abdomen pelvis:  IMPRESSION: No evidence of thoracic or abdominal aortic aneurysm or dissection.  Fatty infiltration of the liver.  No other significant abnormality seen in the chest, abdomen or pelvis __________________________________________  PROCEDURES  Procedure(s) performed: None  Critical Care performed: None  ____________________________________________   ED COURSE / ASSESSMENT AND PLAN  Pertinent labs & imaging results that were available during my care of the patient were reviewed by me and considered in my medical decision making (see chart for details).   Mr. Dahir is here for  left-sided chest discomfort which is pressure and somewhat exertional and given his body habitus being obese with hypertension, I'm somewhat concerned about cardiac etiology. His EKG is really normal in appearance. His initial troponin is negative. His chest x-ray was reassuring. He still complaining of about 7 out of 10 discomfort and I am going to CT his chest to evaluate the aorta.  When the tried nitroglycerin. Urine had baby aspirin, I will hold off on additional aspirin until CT is performed.  He denies symptoms of trauma, overuse, GI, or stress.  I discussed with him that although workup is reassuring evaluation in the ED, we could either admit him to the hospital overnight for cardiology workup and evaluation and consider stress test, but patient would really rather go home. I spoke with on-call cardiologist Dr. Juliann Pares, who is in agreement to see him next day early in the morning.  I was called to patient's bedside because he was having eye new/worsening chest discomfort. Repeat EKG was obtained and although episode had already subsided somewhat, T waves were now inverted in 3 and aVF. I am going to go ahead and admit him given concern for ongoing chest discomfort with change in EKG and he is somewhat concerning to me. Patient at this point was given 3 additional baby aspirin because he took one this morning already. He was given nitroglycerin paste.  I discussed with hospitalist for admission.   CONSULTATIONS:   Hospitalist for admission.  Initially spoke with cardiology.   Patient / Family / Caregiver informed of clinical course, medical decision-making process, and agree with plan.  ___________________________________________   FINAL CLINICAL IMPRESSION(S) / ED DIAGNOSES   Final diagnoses:  Nonspecific chest pain              Note: This dictation was prepared with Dragon dictation. Any transcriptional errors that result from this process are unintentional     Governor Rooks, MD 03/23/17 1529

## 2017-03-23 NOTE — ED Triage Notes (Signed)
Pt reports left chest pain that started on Sunday, states he took wife's NTG and had relief, states today the pain is not getting any better and states "it feels like a car resting on my chest"  Pt is short of breath in triage. C/o chest pain radiating down left arm.

## 2017-03-23 NOTE — Plan of Care (Signed)
Problem: Pain Managment: Goal: General experience of comfort will improve Outcome: Not Progressing Patient continue to complain of chest pain despite receiving nitrates.

## 2017-03-23 NOTE — ED Notes (Signed)
Pt to ed with c/o chest pain that started over the weekend, pt reports he took his wifes nitro with relief.  Reports pain began again today and has continued.  Radiates to left arm, pt also reports sob with activity, lungs clear bilat.  Pt alert and oriented on CM, skin warm and dry.

## 2017-03-23 NOTE — ED Notes (Signed)
Dr Shaune Pollacklord at bedside at this time.

## 2017-03-23 NOTE — H&P (Signed)
[employer] [facility] [place] [facility]   PATIENT NAME: Micheal Bowman    MR#:  [id number]  DATE OF BIRTH:  10-28-1965  DATE OF ADMISSION:  03/23/2017  PRIMARY CARE PHYSICIAN: [facility]   REQUESTING/REFERRING PHYSICIAN: Dr. Governor Rooks  CHIEF COMPLAINT:   Chief Complaint  Patient presents with  . Chest Pain    HISTORY OF PRESENT ILLNESS:  Micheal Bowman  is a 51 y.o. male with a known history of COPD with ongoing tobacco abuse, hypertension, osteoarthritis, chronic pain, restless leg syndrome, BPH who presents to the hospital complaining of chest pain. Patient describes his chest pain located in the center of his chest radiating to his left arm associated with some shortness of breath nausea, dizziness but no true syncope. Patient says he's had chest pain now for the past week but it has progressively gotten worse. He was concerned and therefore came to the ER for further evaluation. Patient underwent a CT scan of the chest contrast which showed no evidence of pulmonary emboli. Patient does have significant risk factors for coronary artery disease and therefore hospitalist services were called for treatment and evaluation.  PAST MEDICAL HISTORY:   Past Medical History:  Diagnosis Date  . Arthritis   . Chronic pain   . COPD (chronic obstructive pulmonary disease) (HCC)   . Diverticulitis   . Fatty liver   . Hypertension     PAST SURGICAL HISTORY:   Past Surgical History:  Procedure Laterality Date  . APPENDECTOMY    . BACK SURGERY    . CHOLECYSTECTOMY    . NASAL SINUS SURGERY    . SHOULDER SURGERY Right     SOCIAL HISTORY:   Social History  Substance Use Topics  . Smoking status: Former Smoker    Years: 35.00    Types: Cigars    Quit date: 07/06/2014  . Smokeless tobacco: Current User    Types: Snuff  . Alcohol use Yes     Comment: beer every 3 months    FAMILY HISTORY:   Family History  Problem Relation  Age of Onset  . Diabetes Mother   . Heart disease Father   . Cancer Sister   . Cancer Sister     DRUG ALLERGIES:   Allergies  Allergen Reactions  . Gabapentin Other (See Comments)    Sour stomach, diarreha  . Hydromorphone Other (See Comments)    headache  . Tylenol [Acetaminophen] Other (See Comments)    "makes my stomach cramp"  . Celecoxib Nausea Only  . Codeine Itching  . Ibuprofen Nausea And Vomiting and Other (See Comments)    SICK ON STOMACH, CRAMPS    REVIEW OF SYSTEMS:   Review of Systems  Constitutional: Negative for fever and weight loss.  HENT: Negative for congestion, nosebleeds and tinnitus.   Eyes: Negative for blurred vision, double vision and redness.  Respiratory: Negative for cough, hemoptysis and shortness of breath.   Cardiovascular: Positive for chest pain. Negative for orthopnea, leg swelling and PND.  Gastrointestinal: Negative for abdominal pain, diarrhea, melena, nausea and vomiting.  Genitourinary: Negative for dysuria, hematuria and urgency.  Musculoskeletal: Negative for falls and joint pain.  Neurological: Negative for dizziness, tingling, sensory change, focal weakness, seizures, weakness and headaches.  Endo/Heme/Allergies: Negative for polydipsia. Does not bruise/bleed easily.  Psychiatric/Behavioral: Negative for depression and memory loss. The patient is not nervous/anxious.     MEDICATIONS AT HOME:   Prior to Admission medications   Medication  Sig Start Date End Date Taking? Authorizing Provider  aspirin EC 81 MG tablet Take 81 mg by mouth.   Yes [provider]  Cholecalciferol (VITAMIN D3) 5000 units TABS Take by mouth.   Yes [provider]  citalopram (CELEXA) 40 MG tablet Take 40 mg by mouth daily. 03/05/17  Yes [provider]  docusate sodium (COLACE) 100 MG capsule Take 100 mg by mouth every other day.    Yes [provider]  fluticasone (FLONASE) 50 MCG/ACT nasal spray Place 2 sprays into  both nostrils daily. 05/03/16  Yes [provider]  lisinopril-hydrochlorothiazide (PRINZIDE,ZESTORETIC) 10-12.5 MG tablet Take 1 tablet by mouth daily. 06/17/16  Yes [provider]  meloxicam (MOBIC) 15 MG tablet Take 15 mg by mouth daily. 06/17/16  Yes [provider]  metoprolol succinate (TOPROL-XL) 25 MG 24 hr tablet Take 25 mg by mouth daily. 06/17/16  Yes [provider]  omeprazole (PRILOSEC) 40 MG capsule Take 40 mg by mouth daily. 06/17/16  Yes [provider]  Oxycodone HCl 10 MG TABS Take 10 mg by mouth every 6 (six) hours as needed (PAIN).    Yes [provider]  QUEtiapine (SEROQUEL) 100 MG tablet Take 200 mg by mouth at bedtime.   Yes [provider]  rOPINIRole (REQUIP) 1 MG tablet Take 1 mg by mouth at bedtime. 06/17/16  Yes [provider]  SPIRIVA RESPIMAT 1.25 MCG/ACT AERS INHALE 2 PUFFS BY MOUTH DAILY 05/20/16  Yes [provider]  tamsulosin (FLOMAX) 0.4 MG CAPS capsule Take 0.4 mg by mouth daily. 03/07/17  Yes [provider]  traZODone (DESYREL) 100 MG tablet Take 100 mg by mouth at bedtime as needed for sleep. 03/06/17  Yes [provider]  primidone (MYSOLINE) 50 MG tablet Take 1 and 1/2 tablets at bedtime. (75 mg total) Patient not taking: Reported on 03/23/2017 09/06/16   Nita SicklePatel, Donika K, DO      VITAL SIGNS:  Blood pressure 121/70, pulse 87, temperature 98.3 F (36.8 C), temperature source Oral, resp. rate 15, height 6' (1.829 m), weight (!) 157.4 kg (347 lb), SpO2 97 %.  PHYSICAL EXAMINATION:  Physical Exam  GENERAL:  51 y.o.-year-old obese patient lying in the bed in no acute distress.  EYES: Pupils equal, round, reactive to light and accommodation. No scleral icterus. Extraocular muscles intact.  HEENT: Head atraumatic, normocephalic. Oropharynx and nasopharynx clear. No oropharyngeal erythema, moist oral mucosa  NECK:  Supple, no jugular venous distention. No thyroid  enlargement, no tenderness.  LUNGS: Normal breath sounds bilaterally, no wheezing, rales, rhonchi. No use of accessory muscles of respiration.  CARDIOVASCULAR: S1, S2 RRR. No murmurs, rubs, gallops, clicks.  ABDOMEN: Soft, nontender, nondistended. Bowel sounds present. No organomegaly or mass.  EXTREMITIES: No pedal edema, cyanosis, or clubbing. + 2 pedal & radial pulses b/l.   NEUROLOGIC: Cranial nerves II through XII are intact. No focal Motor or sensory deficits appreciated b/l. PSYCHIATRIC: The patient is alert and oriented x 3. SKIN: No obvious rash, lesion, or ulcer.   LABORATORY PANEL:   CBC  Recent Labs Lab 03/23/17 0958  WBC 11.2*  HGB 14.5  HCT 44.1  PLT 327   ------------------------------------------------------------------------------------------------------------------  Chemistries   Recent Labs Lab 03/23/17 0958  NA 135  K 4.4  CL 99*  CO2 31  GLUCOSE 138*  BUN 19  CREATININE 1.04  CALCIUM 9.1   ------------------------------------------------------------------------------------------------------------------  Cardiac Enzymes  Recent Labs Lab 03/23/17 1304  TROPONINI <0.03   ------------------------------------------------------------------------------------------------------------------  RADIOLOGY:  Dg Chest 2 View  Result Date: 03/23/2017 CLINICAL DATA:  Chest pain . EXAM: CHEST  2 VIEW COMPARISON:  CT 05/26/2016. FINDINGS: Mediastinum and hilar structures normal. Lungs are clear. Borderline cardiomegaly. No pleural effusion or pneumothorax. No acute bony abnormality. Degenerative changes thoracic spine IMPRESSION: 1.  Borderline cardiomegaly.  No pulmonary venous congestion. 2. No acute pulmonary disease. Electronically Signed   By: Maisie Fus  Register   On: 03/23/2017 10:21   Ct Angio Chest/abd/pel For Dissection W And/or Wo Contrast  Result Date: 03/23/2017 CLINICAL DATA:  Left-sided chest pain. EXAM: CT ANGIOGRAPHY CHEST, ABDOMEN AND PELVIS  TECHNIQUE: Multidetector CT imaging through the chest, abdomen and pelvis was performed using the standard protocol during bolus administration of intravenous contrast. Multiplanar reconstructed images and MIPs were obtained and reviewed to evaluate the vascular anatomy. CONTRAST:  125 mL of Isovue 370 intravenously. COMPARISON:  CT scan of May 26, 2016. FINDINGS: CTA CHEST FINDINGS Cardiovascular: Preferential opacification of the thoracic aorta. No evidence of thoracic aortic aneurysm or dissection. Normal heart size. No pericardial effusion. Mediastinum/Nodes: No enlarged mediastinal, hilar, or axillary lymph nodes. Thyroid gland, trachea, and esophagus demonstrate no significant findings. Lungs/Pleura: Lungs are clear. No pleural effusion or pneumothorax. Musculoskeletal: No chest wall abnormality. No acute or significant osseous findings. Review of the MIP images confirms the above findings. CTA ABDOMEN AND PELVIS FINDINGS VASCULAR Aorta: Normal caliber aorta without aneurysm, dissection, vasculitis or significant stenosis. Celiac: Patent without evidence of aneurysm, dissection, vasculitis or significant stenosis. SMA: Patent without evidence of aneurysm, dissection, vasculitis or significant stenosis. Renals: Both renal arteries are patent without evidence of aneurysm, dissection, vasculitis, fibromuscular dysplasia or significant stenosis. IMA: Patent without evidence of aneurysm, dissection, vasculitis or significant stenosis. Inflow: Patent without evidence of aneurysm, dissection, vasculitis or significant stenosis. Veins: No obvious venous abnormality within the limitations of this arterial phase study. Review of the MIP images confirms the above findings. NON-VASCULAR Hepatobiliary: Fatty infiltration of the liver is noted. Status post cholecystectomy. Pancreas: Unremarkable. No pancreatic ductal dilatation or surrounding inflammatory changes. Spleen: Normal in size without focal abnormality.  Adrenals/Urinary Tract: Adrenal glands are unremarkable. Kidneys are normal, without renal calculi, focal lesion, or hydronephrosis. Bladder is unremarkable. Stomach/Bowel: The stomach appears normal. There is no evidence of bowel obstruction or ileus. Status post appendectomy. Lymphatic: No adenopathy is noted. Reproductive: Prostate is unremarkable. Other: No abdominal wall hernia or abnormality. No abdominopelvic ascites. Musculoskeletal: Status post surgical posterior fusion of lower lumbar spine. No acute abnormality is noted. Review of the MIP images confirms the above findings. IMPRESSION: No evidence of thoracic or abdominal aortic aneurysm or dissection. Fatty infiltration of the liver. No other significant abnormality seen in the chest, abdomen or pelvis. Electronically Signed   By: Lupita Raider, M.D.   On: 03/23/2017 14:32     IMPRESSION AND PLAN:   51 year old male with past medical history of hypertension, COPD, osteoarthritis, chronic pain, history of sleep apnea presented to the hospital due to chest pain.  1. Chest pain-patient's symptoms are very atypical for angina but he does have significant risk factors given his obesity, tobacco abuse, and significant family history. -Patient has had a negative cardiac catheterization at [facility] in 2015.  -We'll observe on telemetry, cycle his cardiac markers, his EKG shows no acute ST or T-wave changes. -I'll get a nuclear medicine stress test in the morning. -Continue aspirin, nitroglycerin, beta blocker  2. History of COPD-no acute exacerbation. We'll place on DuoNeb nebs as needed.  3.  GERD-continue Protonix.  4. Essential hypertension-continue lisinopril/HCTZ, metoprolol.  5. Chronic pain-continue oxycodone as needed.  6. Restless leg syndrome-continue Requip.  7. BPH-continue Flomax.  8. Osteoarthritis -  continue meloxicam.  9. Depression-continue Celexa.    All the records are reviewed and case discussed with ED  provider. Management plans discussed with the patient, family and they are in agreement.  CODE STATUS: Full code  TOTAL TIME TAKING CARE OF THIS PATIENT: 40 minutes.    Houston Siren M.D on 03/23/2017 at 4:39 PM  Between 7am to 6pm - Pager - [phone]  After 6pm go to [email or web address] - password [email or web address] [facility]  [place] [facility]  Office  [phone]  CC: Primary care physician; [facility]

## 2017-03-23 NOTE — ED Notes (Signed)
Pt states he has a headache since placing nitro on skin.  Pt also reports that chest pain has been unrelieved from nitro paste.  Spoke with dr Quentin Cornwallsanani and he states to remove paste and offer pt tylenol for headache,  Pt states " just take the paste off"  Pt refused tylenol for headache.  Awaiting admission orders at this time.

## 2017-03-24 ENCOUNTER — Observation Stay (HOSPITAL_BASED_OUTPATIENT_CLINIC_OR_DEPARTMENT_OTHER): Payer: Medicaid Other

## 2017-03-24 DIAGNOSIS — R079 Chest pain, unspecified: Secondary | ICD-10-CM | POA: Diagnosis not present

## 2017-03-24 LAB — NM MYOCAR MULTI W/SPECT W/WALL MOTION / EF
CSEPEDS: 0 s
CSEPHR: 66 %
CSEPPHR: 113 {beats}/min
Estimated workload: 1 METS
Exercise duration (min): 0 min
MPHR: 170 {beats}/min
Rest HR: 85 {beats}/min

## 2017-03-24 LAB — TROPONIN I

## 2017-03-24 MED ORDER — TECHNETIUM TC 99M TETROFOSMIN IV KIT
29.6400 | PACK | Freq: Once | INTRAVENOUS | Status: AC | PRN
  Administered 2017-03-24: 29.64 via INTRAVENOUS

## 2017-03-24 MED ORDER — DIPHENHYDRAMINE HCL 50 MG/ML IJ SOLN
25.0000 mg | Freq: Once | INTRAMUSCULAR | Status: AC
  Administered 2017-03-24: 25 mg via INTRAVENOUS
  Filled 2017-03-24: qty 1

## 2017-03-24 MED ORDER — OXYCODONE HCL 10 MG PO TABS: 10.0000 mg | ORAL_TABLET | Freq: Four times a day (QID) | ORAL | 0 refills | Status: AC | PRN

## 2017-03-24 MED ORDER — REGADENOSON 0.4 MG/5ML IV SOLN
0.4000 mg | Freq: Once | INTRAVENOUS | Status: AC
  Administered 2017-03-24: 0.4 mg via INTRAVENOUS

## 2017-03-24 MED ORDER — METHYLPREDNISOLONE SODIUM SUCC 125 MG IJ SOLR
125.0000 mg | Freq: Once | INTRAMUSCULAR | Status: AC
  Administered 2017-03-24: 125 mg via INTRAVENOUS
  Filled 2017-03-24: qty 2

## 2017-03-24 NOTE — Discharge Summary (Signed)
[facility] - [place] at [facility], 51 y.o., DOB 1966-09-01, MRN [id number]. Admission date: 03/23/2017 Discharge Date 03/24/2017 Primary MD [facility] Admitting Physician Houston Siren, MD  Admission Diagnosis  Nonspecific chest pain [R07.9]  Discharge Diagnosis   Active Problems:   Noncardiac chest pain status post stress test Sleep apnea untreated noncompliance with CPAP Chronic pain COPD without exacerbation Diverticulitis in the past Fatty liver Essential hypertension       Hospital Course patient is a 51 year old with history of COPD ongoing tobacco abuse hypertension untreated sleep apnea presented with chest pain. EKG was nonrevealing his cardiac enzymes are  remained negative he underwent a stress test without ischemia or infarct his EF was normal. Patient's symptoms are very atypical. I strongly encouraged him to use his CPAP machine. Also follow with pulmonary for COPD evaluation in severity..         Consults  None  Significant Tests:  See full reports for all details     Dg Chest 2 View  Result Date: 03/23/2017 CLINICAL DATA:  Chest pain . EXAM: CHEST  2 VIEW COMPARISON:  CT 05/26/2016. FINDINGS: Mediastinum and hilar structures normal. Lungs are clear. Borderline cardiomegaly. No pleural effusion or pneumothorax. No acute bony abnormality. Degenerative changes thoracic spine IMPRESSION: 1.  Borderline cardiomegaly.  No pulmonary venous congestion. 2. No acute pulmonary disease. Electronically Signed   By: Maisie Fus  Register   On: 03/23/2017 10:21   Nm Myocar Multi W/spect Izetta Dakin Motion / Ef  Result Date: 03/24/2017 Pharmacological myocardial perfusion imaging study with no significant  ischemia Normal wall motion, EF estimated at 81% No EKG changes concerning for ischemia at peak stress or in recovery. Low risk scan Signed, Dossie Arbour, MD, Ph.D [facility]   Ct Angio Chest/abd/pel For  Dissection W And/or Wo Contrast  Result Date: 03/23/2017 CLINICAL DATA:  Left-sided chest pain. EXAM: CT ANGIOGRAPHY CHEST, ABDOMEN AND PELVIS TECHNIQUE: Multidetector CT imaging through the chest, abdomen and pelvis was performed using the standard protocol during bolus administration of intravenous contrast. Multiplanar reconstructed images and MIPs were obtained and reviewed to evaluate the vascular anatomy. CONTRAST:  125 mL of Isovue 370 intravenously. COMPARISON:  CT scan of May 26, 2016. FINDINGS: CTA CHEST FINDINGS Cardiovascular: Preferential opacification of the thoracic aorta. No evidence of thoracic aortic aneurysm or dissection. Normal heart size. No pericardial effusion. Mediastinum/Nodes: No enlarged mediastinal, hilar, or axillary lymph nodes. Thyroid gland, trachea, and esophagus demonstrate no significant findings. Lungs/Pleura: Lungs are clear. No pleural effusion or pneumothorax. Musculoskeletal: No chest wall abnormality. No acute or significant osseous findings. Review of the MIP images confirms the above findings. CTA ABDOMEN AND PELVIS FINDINGS VASCULAR Aorta: Normal caliber aorta without aneurysm, dissection, vasculitis or significant stenosis. Celiac: Patent without evidence of aneurysm, dissection, vasculitis or significant stenosis. SMA: Patent without evidence of aneurysm, dissection, vasculitis or significant stenosis. Renals: Both renal arteries are patent without evidence of aneurysm, dissection, vasculitis, fibromuscular dysplasia or significant stenosis. IMA: Patent without evidence of aneurysm, dissection, vasculitis or significant stenosis. Inflow: Patent without evidence of aneurysm, dissection, vasculitis or significant stenosis. Veins: No obvious venous abnormality within the limitations of this arterial phase study. Review of the MIP images confirms the above findings. NON-VASCULAR Hepatobiliary: Fatty infiltration of the liver is noted. Status post cholecystectomy.  Pancreas: Unremarkable. No pancreatic ductal dilatation or surrounding inflammatory changes. Spleen: Normal in size without focal abnormality. Adrenals/Urinary Tract: Adrenal glands are unremarkable. Kidneys are normal, without  renal calculi, focal lesion, or hydronephrosis. Bladder is unremarkable. Stomach/Bowel: The stomach appears normal. There is no evidence of bowel obstruction or ileus. Status post appendectomy. Lymphatic: No adenopathy is noted. Reproductive: Prostate is unremarkable. Other: No abdominal wall hernia or abnormality. No abdominopelvic ascites. Musculoskeletal: Status post surgical posterior fusion of lower lumbar spine. No acute abnormality is noted. Review of the MIP images confirms the above findings. IMPRESSION: No evidence of thoracic or abdominal aortic aneurysm or dissection. Fatty infiltration of the liver. No other significant abnormality seen in the chest, abdomen or pelvis. Electronically Signed   By: Lupita RaiderJames  Green Jr, M.D.   On: 03/23/2017 14:32       Today   Subjective:   Micheal Bowman   states that he starts shaking when he is exerting himself   Objective:   Blood pressure 112/67, pulse (!) 105, temperature 99.7 F (37.6 C), temperature source Oral, resp. rate 18, height 6' (1.829 m), weight (!) 347 lb (157.4 kg), SpO2 92 %.  .  Intake/Output Summary (Last 24 hours) at 03/24/17 1535 Last data filed at 03/24/17 1359  Gross per 24 hour  Intake              120 ml  Output              400 ml  Net             -280 ml    Exam VITAL SIGNS: Blood pressure 112/67, pulse (!) 105, temperature 99.7 F (37.6 C), temperature source Oral, resp. rate 18, height 6' (1.829 m), weight (!) 347 lb (157.4 kg), SpO2 92 %.  GENERAL:  51 y.o.-year-old patient lying in the bed with no acute distress.  EYES: Pupils equal, round, reactive to light and accommodation. No scleral icterus. Extraocular muscles intact.  HEENT: Head atraumatic, normocephalic. Oropharynx and nasopharynx  clear.  NECK:  Supple, no jugular venous distention. No thyroid enlargement, no tenderness.  LUNGS: Normal breath sounds bilaterally, no wheezing, rales,rhonchi or crepitation. No use of accessory muscles of respiration.  CARDIOVASCULAR: S1, S2 normal. No murmurs, rubs, or gallops.  ABDOMEN: Soft, nontender, nondistended. Bowel sounds present. No organomegaly or mass.  EXTREMITIES: No pedal edema, cyanosis, or clubbing.  NEUROLOGIC: Cranial nerves II through XII are intact. Muscle strength 5/5 in all extremities. Sensation intact. Gait not checked.  PSYCHIATRIC: The patient is alert and oriented x 3.  SKIN: No obvious rash, lesion, or ulcer.   Data Review     CBC w Diff: Lab Results  Component Value Date   WBC 12.5 (H) 03/23/2017   HGB 14.4 03/23/2017   HCT 43.8 03/23/2017   PLT 293 03/23/2017   CMP: Lab Results  Component Value Date   NA 135 03/23/2017   K 4.4 03/23/2017   CL 99 (L) 03/23/2017   CO2 31 03/23/2017   BUN 19 03/23/2017   CREATININE 0.97 03/23/2017   PROT 7.6 05/25/2016   ALBUMIN 3.9 05/25/2016   BILITOT 0.5 05/25/2016   ALKPHOS 74 05/25/2016   AST 20 05/25/2016   ALT 13 (L) 05/25/2016  .  Micro Results No results found for this or any previous visit (from the past 240 hour(s)).      Code Status Orders        Start     Ordered   03/23/17 1857  Full code  Continuous     03/23/17 1857    Code Status History    Date Active Date Inactive Code Status Order ID  Comments User Context   This patient has a current code status but no historical code status.          Follow-up Information    Callwood, Bobbie Stack, MD. Go in 1 day.   Specialties:  Cardiology, Internal Medicine Contact information: [address] [facility] - CARDIOLOGY [place] [address] [phone]        [facility] In 1 week.   Contact information: [address] [place]  [address] [phone]        Merwyn Katos, MD In 2 weeks.   Specialty:  Pulmonary Disease Why:  copd and sleep apnea  Contact information: [address] [place] [address] [phone]           Discharge Medications   Allergies as of 03/24/2017      Reactions   Gabapentin Other (See Comments)   Sour stomach, diarreha   Hydromorphone Other (See Comments)   headache   Tylenol [acetaminophen] Other (See Comments)   "makes my stomach cramp"   Celecoxib Nausea Only   Codeine Itching   Ibuprofen Nausea And Vomiting, Other (See Comments)   SICK ON STOMACH, CRAMPS      Medication List    TAKE these medications   aspirin EC 81 MG tablet Take 81 mg by mouth.   citalopram 40 MG tablet Commonly known as:  CELEXA Take 40 mg by mouth daily.   docusate sodium 100 MG capsule Commonly known as:  COLACE Take 100 mg by mouth every other day.   fluticasone 50 MCG/ACT nasal spray Commonly known as:  FLONASE Place 2 sprays into both nostrils daily.   lisinopril-hydrochlorothiazide 10-12.5 MG tablet Commonly known as:  PRINZIDE,ZESTORETIC Take 1 tablet by mouth daily.   meloxicam 15 MG tablet Commonly known as:  MOBIC Take 15 mg by mouth daily.   metoprolol succinate 25 MG 24 hr tablet Commonly known as:  TOPROL-XL Take 25 mg by mouth daily.   omeprazole 40 MG capsule Commonly known as:  PRILOSEC Take 40 mg by mouth daily.   Oxycodone HCl 10 MG Tabs Take 1 tablet (10 mg total) by mouth every 6 (six) hours as needed (PAIN).   primidone 50 MG tablet Commonly known as:  MYSOLINE Take 1 and 1/2 tablets at bedtime. (75 mg total)   QUEtiapine 100 MG tablet Commonly known as:  SEROQUEL Take 200 mg by mouth at bedtime.   rOPINIRole 1 MG tablet Commonly known as:  REQUIP Take 1 mg by mouth at bedtime.   SPIRIVA RESPIMAT 1.25 MCG/ACT Aers Generic drug:  Tiotropium Bromide Monohydrate INHALE 2 PUFFS BY MOUTH DAILY   tamsulosin 0.4 MG Caps  capsule Commonly known as:  FLOMAX Take 0.4 mg by mouth daily.   traZODone 100 MG tablet Commonly known as:  DESYREL Take 100 mg by mouth at bedtime as needed for sleep.   Vitamin D3 5000 units Tabs Take by mouth.          Total Time in preparing paper work, data evaluation and todays exam - 35 minutes  Auburn Bilberry M.D on 03/24/2017 at 3:35 PM  [facility]   Office  [phone]

## 2017-03-24 NOTE — Discharge Instructions (Signed)
[facility] - [place] at [facility]  DIET:  Low fat low cholestrol diet  DISCHARGE CONDITION:  Stable  ACTIVITY:  Activity as tolerated  OXYGEN:  Home Oxygen: No.   Oxygen Delivery: room air  DISCHARGE LOCATION:  home    ADDITIONAL DISCHARGE INSTRUCTION:   If you experience worsening of your admission symptoms, develop shortness of breath, life threatening emergency, suicidal or homicidal thoughts you must seek medical attention immediately by calling 911 or calling your MD immediately  if symptoms less severe.  You Must read complete instructions/literature along with all the possible adverse reactions/side effects for all the Medicines you take and that have been prescribed to you. Take any new Medicines after you have completely understood and accpet all the possible adverse reactions/side effects.   Please note  You were cared for by a hospitalist during your hospital stay. If you have any questions about your discharge medications or the care you received while you were in the hospital after you are discharged, you can call the unit and asked to speak with the hospitalist on call if the hospitalist that took care of you is not available. Once you are discharged, your primary care physician will handle any further medical issues. Please note that NO REFILLS for any discharge medications will be authorized once you are discharged, as it is imperative that you return to your primary care physician (or establish a relationship with a primary care physician if you do not have one) for your aftercare needs so that they can reassess your need for medications and monitor your lab values.

## 2017-03-24 NOTE — Progress Notes (Signed)
Patient complaining of "swelling in back of throat", and difficulty breathing. Patients uvula extremely swollen. MD Pyreddy notified. Verbals orders given for IV solu-medrol and benadryl. Will administer and continue to monitor.   Mayra NeerNesbitt, Nneka Blanda M

## 2017-03-24 NOTE — Progress Notes (Signed)
IV and tele removed from patient. No acute distress at this time. Discharge instructions given to patient and wife along with hard copy prescription. Verbalized understanding. Wife at bedside and will be transporting patient home.

## 2017-03-25 ENCOUNTER — Observation Stay: Payer: Medicaid Other

## 2017-03-25 LAB — HIV ANTIBODY (ROUTINE TESTING W REFLEX): HIV Screen 4th Generation wRfx: NONREACTIVE

## 2017-05-18 DIAGNOSIS — F0630 Mood disorder due to known physiological condition, unspecified: Secondary | ICD-10-CM | POA: Insufficient documentation

## 2017-05-18 DIAGNOSIS — Z720 Tobacco use: Secondary | ICD-10-CM | POA: Insufficient documentation

## 2017-05-18 HISTORY — DX: Tobacco use: Z72.0

## 2017-05-18 HISTORY — DX: Mood disorder due to known physiological condition, unspecified: F06.30

## 2017-05-19 DIAGNOSIS — E1141 Type 2 diabetes mellitus with diabetic mononeuropathy: Secondary | ICD-10-CM

## 2017-05-19 HISTORY — DX: Type 2 diabetes mellitus with diabetic mononeuropathy: E11.41

## 2017-06-01 DIAGNOSIS — F1190 Opioid use, unspecified, uncomplicated: Secondary | ICD-10-CM | POA: Insufficient documentation

## 2017-06-01 HISTORY — DX: Opioid use, unspecified, uncomplicated: F11.90

## 2017-06-12 DIAGNOSIS — R7982 Elevated C-reactive protein (CRP): Secondary | ICD-10-CM | POA: Insufficient documentation

## 2017-06-12 DIAGNOSIS — R700 Elevated erythrocyte sedimentation rate: Secondary | ICD-10-CM

## 2017-06-12 DIAGNOSIS — R769 Abnormal immunological finding in serum, unspecified: Secondary | ICD-10-CM

## 2017-06-12 HISTORY — DX: Elevated erythrocyte sedimentation rate: R70.0

## 2017-06-12 HISTORY — DX: Elevated C-reactive protein (CRP): R79.82

## 2017-06-12 HISTORY — DX: Abnormal immunological finding in serum, unspecified: R76.9

## 2017-07-31 DIAGNOSIS — R195 Other fecal abnormalities: Secondary | ICD-10-CM | POA: Insufficient documentation

## 2017-07-31 HISTORY — DX: Other fecal abnormalities: R19.5

## 2017-09-10 ENCOUNTER — Observation Stay
Admission: EM | Admit: 2017-09-10 | Discharge: 2017-09-12 | Disposition: A | Discharge status: Home / Self Care | Payer: Medicaid Other | Attending: Internal Medicine | Admitting: Internal Medicine

## 2017-09-10 ENCOUNTER — Other Ambulatory Visit: Payer: Self-pay

## 2017-09-10 ENCOUNTER — Emergency Department: Payer: Medicaid Other

## 2017-09-10 DIAGNOSIS — Z87891 Personal history of nicotine dependence: Secondary | ICD-10-CM | POA: Diagnosis not present

## 2017-09-10 DIAGNOSIS — R0789 Other chest pain: Principal | ICD-10-CM | POA: Insufficient documentation

## 2017-09-10 DIAGNOSIS — G4733 Obstructive sleep apnea (adult) (pediatric): Secondary | ICD-10-CM | POA: Diagnosis not present

## 2017-09-10 DIAGNOSIS — K3184 Gastroparesis: Secondary | ICD-10-CM | POA: Insufficient documentation

## 2017-09-10 DIAGNOSIS — Z7982 Long term (current) use of aspirin: Secondary | ICD-10-CM | POA: Diagnosis not present

## 2017-09-10 DIAGNOSIS — Z9119 Patient's noncompliance with other medical treatment and regimen: Secondary | ICD-10-CM | POA: Insufficient documentation

## 2017-09-10 DIAGNOSIS — J449 Chronic obstructive pulmonary disease, unspecified: Secondary | ICD-10-CM | POA: Diagnosis not present

## 2017-09-10 DIAGNOSIS — Z794 Long term (current) use of insulin: Secondary | ICD-10-CM | POA: Insufficient documentation

## 2017-09-10 DIAGNOSIS — G894 Chronic pain syndrome: Secondary | ICD-10-CM | POA: Diagnosis not present

## 2017-09-10 DIAGNOSIS — Z79899 Other long term (current) drug therapy: Secondary | ICD-10-CM | POA: Diagnosis not present

## 2017-09-10 DIAGNOSIS — I10 Essential (primary) hypertension: Secondary | ICD-10-CM | POA: Diagnosis not present

## 2017-09-10 DIAGNOSIS — E1143 Type 2 diabetes mellitus with diabetic autonomic (poly)neuropathy: Secondary | ICD-10-CM | POA: Diagnosis not present

## 2017-09-10 DIAGNOSIS — K219 Gastro-esophageal reflux disease without esophagitis: Secondary | ICD-10-CM | POA: Diagnosis not present

## 2017-09-10 DIAGNOSIS — N400 Enlarged prostate without lower urinary tract symptoms: Secondary | ICD-10-CM | POA: Insufficient documentation

## 2017-09-10 DIAGNOSIS — R51 Headache: Secondary | ICD-10-CM

## 2017-09-10 DIAGNOSIS — R079 Chest pain, unspecified: Secondary | ICD-10-CM

## 2017-09-10 DIAGNOSIS — R0602 Shortness of breath: Secondary | ICD-10-CM

## 2017-09-10 DIAGNOSIS — Z6841 Body Mass Index (BMI) 40.0 and over, adult: Secondary | ICD-10-CM | POA: Diagnosis not present

## 2017-09-10 DIAGNOSIS — Z791 Long term (current) use of non-steroidal anti-inflammatories (NSAID): Secondary | ICD-10-CM | POA: Diagnosis not present

## 2017-09-10 DIAGNOSIS — R519 Headache, unspecified: Secondary | ICD-10-CM

## 2017-09-10 LAB — CBC
HCT: 44.8 % (ref 40.0–52.0)
Hemoglobin: 14.9 g/dL (ref 13.0–18.0)
MCH: 28.1 pg (ref 26.0–34.0)
MCHC: 33.3 g/dL (ref 32.0–36.0)
MCV: 84.4 fL (ref 80.0–100.0)
PLATELETS: 305 10*3/uL (ref 150–440)
RBC: 5.31 MIL/uL (ref 4.40–5.90)
RDW: 16.2 % — ABNORMAL HIGH (ref 11.5–14.5)
WBC: 11.5 10*3/uL — AB (ref 3.8–10.6)

## 2017-09-10 LAB — BASIC METABOLIC PANEL
ANION GAP: 10 (ref 5–15)
ANION GAP: 8 (ref 5–15)
BUN: 15 mg/dL (ref 6–20)
BUN: 16 mg/dL (ref 6–20)
CHLORIDE: 97 mmol/L — AB (ref 101–111)
CO2: 27 mmol/L (ref 22–32)
CO2: 29 mmol/L (ref 22–32)
Calcium: 9.2 mg/dL (ref 8.9–10.3)
Calcium: 9.1 mg/dL (ref 8.9–10.3)
Chloride: 96 mmol/L — ABNORMAL LOW (ref 101–111)
Creatinine, Ser: 0.93 mg/dL (ref 0.61–1.24)
Creatinine, Ser: 0.93 mg/dL (ref 0.61–1.24)
GFR calc non Af Amer: >60 mL/min (ref >60)
Glucose, Bld: 256 mg/dL — ABNORMAL HIGH (ref 65–99)
Glucose, Bld: 208 mg/dL — ABNORMAL HIGH (ref 65–99)
POTASSIUM: 6.1 mmol/L — AB (ref 3.5–5.1)
POTASSIUM: 4.6 mmol/L (ref 3.5–5.1)
SODIUM: 133 mmol/L — AB (ref 135–145)
SODIUM: 134 mmol/L — AB (ref 135–145)

## 2017-09-10 LAB — GLUCOSE, CAPILLARY: GLUCOSE-CAPILLARY: 240 mg/dL — AB (ref 65–99)

## 2017-09-10 LAB — COOXEMETRY PANEL: Carboxyhemoglobin: 2.2 % — ABNORMAL HIGH (ref 0.5–1.5)

## 2017-09-10 LAB — TROPONIN I: Troponin I: <0.03 ng/mL (ref <0.03)

## 2017-09-10 MED ORDER — LISINOPRIL-HYDROCHLOROTHIAZIDE 10-12.5 MG PO TABS: 1.0000 | ORAL_TABLET | Freq: Every day | ORAL | Status: DC

## 2017-09-10 MED ORDER — BISACODYL 5 MG PO TBEC: 5.0000 mg | DELAYED_RELEASE_TABLET | Freq: Every day | ORAL | Status: DC | PRN

## 2017-09-10 MED ORDER — ONDANSETRON HCL 4 MG/2ML IJ SOLN: 4.0000 mg | Freq: Four times a day (QID) | INTRAMUSCULAR | Status: DC | PRN

## 2017-09-10 MED ORDER — METFORMIN HCL 500 MG PO TABS
500.0000 mg | ORAL_TABLET | Freq: Two times a day (BID) | ORAL | Status: DC
  Administered 2017-09-11 – 2017-09-12 (×4): 500 mg via ORAL
  Filled 2017-09-10 (×4): qty 1

## 2017-09-10 MED ORDER — METOPROLOL SUCCINATE ER 50 MG PO TB24
25.0000 mg | ORAL_TABLET | Freq: Every day | ORAL | Status: DC
  Administered 2017-09-11 – 2017-09-12 (×2): 25 mg via ORAL
  Filled 2017-09-10 (×2): qty 1

## 2017-09-10 MED ORDER — QUETIAPINE FUMARATE 25 MG PO TABS
100.0000 mg | ORAL_TABLET | Freq: Every day | ORAL | Status: DC
  Administered 2017-09-10 – 2017-09-11 (×2): 100 mg via ORAL
  Filled 2017-09-10 (×2): qty 4

## 2017-09-10 MED ORDER — LISINOPRIL 10 MG PO TABS
10.0000 mg | ORAL_TABLET | Freq: Every day | ORAL | Status: DC
  Administered 2017-09-11 – 2017-09-12 (×2): 10 mg via ORAL
  Filled 2017-09-10 (×2): qty 1

## 2017-09-10 MED ORDER — ONDANSETRON HCL 4 MG/2ML IJ SOLN
4.0000 mg | Freq: Once | INTRAMUSCULAR | Status: AC
  Administered 2017-09-10: 4 mg via INTRAVENOUS
  Filled 2017-09-10: qty 2

## 2017-09-10 MED ORDER — ENOXAPARIN SODIUM 40 MG/0.4ML ~~LOC~~ SOLN
40.0000 mg | Freq: Two times a day (BID) | SUBCUTANEOUS | Status: DC
  Administered 2017-09-10 – 2017-09-11 (×3): 40 mg via SUBCUTANEOUS
  Filled 2017-09-10 (×3): qty 0.4

## 2017-09-10 MED ORDER — OXYCODONE HCL 5 MG PO TABS
10.0000 mg | ORAL_TABLET | Freq: Four times a day (QID) | ORAL | Status: DC | PRN
  Administered 2017-09-10 – 2017-09-12 (×7): 10 mg via ORAL
  Filled 2017-09-10 (×7): qty 2

## 2017-09-10 MED ORDER — ONDANSETRON HCL 4 MG PO TABS
4.0000 mg | ORAL_TABLET | Freq: Four times a day (QID) | ORAL | Status: DC | PRN
Start: 2017-09-10 — End: 2017-09-12

## 2017-09-10 MED ORDER — HYDROCHLOROTHIAZIDE 12.5 MG PO CAPS
12.5000 mg | ORAL_CAPSULE | Freq: Every day | ORAL | Status: DC
  Administered 2017-09-11 – 2017-09-12 (×2): 12.5 mg via ORAL
  Filled 2017-09-10 (×2): qty 1

## 2017-09-10 MED ORDER — TIZANIDINE HCL 2 MG PO TABS
4.0000 mg | ORAL_TABLET | Freq: Three times a day (TID) | ORAL | Status: DC | PRN
  Administered 2017-09-11: 4 mg via ORAL
  Administered 2017-09-11: 8 mg via ORAL
  Administered 2017-09-12: 4 mg via ORAL
  Filled 2017-09-10 (×2): qty 4
  Filled 2017-09-10: qty 2
  Filled 2017-09-10: qty 4

## 2017-09-10 MED ORDER — ASPIRIN EC 81 MG PO TBEC
81.0000 mg | DELAYED_RELEASE_TABLET | Freq: Every day | ORAL | Status: DC
  Administered 2017-09-11 – 2017-09-12 (×2): 81 mg via ORAL
  Filled 2017-09-10 (×2): qty 1

## 2017-09-10 MED ORDER — PANTOPRAZOLE SODIUM 40 MG PO TBEC
40.0000 mg | DELAYED_RELEASE_TABLET | Freq: Every day | ORAL | Status: DC
  Administered 2017-09-11 – 2017-09-12 (×2): 40 mg via ORAL
  Filled 2017-09-10 (×2): qty 1

## 2017-09-10 MED ORDER — DOCUSATE SODIUM 100 MG PO CAPS
100.0000 mg | ORAL_CAPSULE | Freq: Two times a day (BID) | ORAL | Status: DC
  Administered 2017-09-10 – 2017-09-12 (×4): 100 mg via ORAL
  Filled 2017-09-10 (×4): qty 1

## 2017-09-10 MED ORDER — TAMSULOSIN HCL 0.4 MG PO CAPS
0.4000 mg | ORAL_CAPSULE | Freq: Every day | ORAL | Status: DC
  Administered 2017-09-11 – 2017-09-12 (×2): 0.4 mg via ORAL
  Filled 2017-09-10 (×2): qty 1

## 2017-09-10 MED ORDER — MORPHINE SULFATE (PF) 4 MG/ML IV SOLN
4.0000 mg | Freq: Once | INTRAVENOUS | Status: AC
  Administered 2017-09-10: 4 mg via INTRAVENOUS
  Filled 2017-09-10: qty 1

## 2017-09-10 MED ORDER — MELOXICAM 15 MG PO TABS
15.0000 mg | ORAL_TABLET | Freq: Every day | ORAL | Status: DC
  Administered 2017-09-11 – 2017-09-12 (×2): 15 mg via ORAL
  Filled 2017-09-10 (×2): qty 2
  Filled 2017-09-10 (×2): qty 1

## 2017-09-10 MED ORDER — ROPINIROLE HCL 1 MG PO TABS
1.0000 mg | ORAL_TABLET | Freq: Every day | ORAL | Status: DC
  Administered 2017-09-10 – 2017-09-11 (×2): 1 mg via ORAL
  Filled 2017-09-10 (×2): qty 1

## 2017-09-10 NOTE — Progress Notes (Signed)
Pharmacist - Prescriber Communication  Enoxaparin dose modified to 40 mg subcutaneously twice daily due to BMI greater than 40.  Terrie Grajales A. Lenaookson, VermontPharm.D., BCPS Clinical Pharmacist 09/10/17 21:27

## 2017-09-10 NOTE — ED Notes (Signed)
Pt back from x-ray, states that when he got up for his x-ray picture it caused the pain in his chest to increase. Pt reports hx of diabetes and copd, pt states that he has had multiple surgeries and states that he has chronic pain from the past injuries and surgeries. Pt given a urinal, no distress noted at this time, family at bedside

## 2017-09-10 NOTE — ED Triage Notes (Addendum)
Pt came to ED via EMS from home, reports sudden onset of chest pain and sob. Reports has no power and is running a portable kerosene heater. Went to sweep snow off car and chest pain and sob got worse.Given 324 asa by EMS and 3 nitro with no relief. PT rates pain 8/10

## 2017-09-10 NOTE — ED Notes (Signed)
Pt given a [place] sandwich tray upon request

## 2017-09-10 NOTE — ED Notes (Signed)
Pt states that he isn't feeling better, states headache, and nausea, dr Langston Maskershaevitz to bedside

## 2017-09-10 NOTE — ED Notes (Signed)
Admitting dr to bedside 

## 2017-09-10 NOTE — Progress Notes (Addendum)
Pt. Arrived via stretcher, and transferred to bed with stand by assist. Fiance at bedside. Pt. Joking and laughing with staff and fiance. Tele applied, NSR, verified with CCMD. Second verifier Claris Gowerharlotte, RN. Skin assessed, bruise to left inner arm at IV insertion site. Scar to lumbar spine and umbilicus. Scattered red areas on ABD. Chest pain 9/10, pt. Repositioned. No SOB or acute distress observed. General room orientation given. Pt. High fall risk R/T prior fall at home. Red band and yellow band placed. Instruction on how to use call bell and ascom system given. Pt. Given sandwich tray. Will continue to monitor pt.

## 2017-09-10 NOTE — H&P (Signed)
[facility] [place] [facility]   PATIENT NAME: Micheal BooksJohnnie Bowman    MR#:  [id number]  DATE OF BIRTH:  02/15/1966  DATE OF ADMISSION:  09/10/2017  PRIMARY CARE PHYSICIAN: System, Pcp Not In   REQUESTING/REFERRING PHYSICIAN: Dr. Pershing ProudSchaevitz  CHIEF COMPLAINT: Chest pain or shortness of breath   Chief Complaint  Patient presents with  . Chest Pain    HISTORY OF PRESENT ILLNESS:  Micheal BooksJohnnie Bowman  is a 51 y.o. male with a known history of essential hypertension, diabetes mellitus, morbid obesity, BPH, chronic pain syndrome comes in because of shortness of breath, chest pain that started today morning he started to shovel the snow.  Patient did only to 3 minutes of snow shoveling but he felt short of breath, headache.  Patient states that her chest pain on the left side associated with shoulder pain.  According to mom the last for this morning and then they were using kerosene heater.  Bunions are negative for 2 times about the mom states she is not comfortable taking him back due to bad weather and also loss of power at home.  Patient also states that he is worried about recurrent chest pains and also tachycardia at times and also wants to have answers for these questions.  Shunt was here in June and at that time , stress test was negative and EF was 80%.  PAST MEDICAL HISTORY:   Past Medical History:  Diagnosis Date  . Arthritis   . Chronic pain   . COPD (chronic obstructive pulmonary disease) (HCC)   . Diverticulitis   . Fatty liver   . Hypertension     PAST SURGICAL HISTOIRY:   Past Surgical History:  Procedure Laterality Date  . APPENDECTOMY    . BACK SURGERY    . CHOLECYSTECTOMY    . NASAL SINUS SURGERY    . SHOULDER SURGERY Right     SOCIAL HISTORY:   Social History   Tobacco Use  . Smoking status: Former Smoker    Years: 35.00    Types: Cigars    Last attempt to quit: 07/06/2014    Years since quitting: 3.1  . Smokeless tobacco: Current  User    Types: Snuff  Substance Use Topics  . Alcohol use: No    Comment: beer every 3 months    FAMILY HISTORY:   Family History  Problem Relation Age of Onset  . Diabetes Mother   . Heart disease Father   . Cancer Sister   . Cancer Sister     DRUG ALLERGIES:   Allergies  Allergen Reactions  . Gabapentin Other (See Comments)    Sour stomach, diarreha  . Hydromorphone Other (See Comments)    headache  . Tylenol [Acetaminophen] Other (See Comments)    "makes my stomach cramp"  . Celecoxib Nausea Only  . Codeine Itching  . Ibuprofen Nausea And Vomiting and Other (See Comments)    SICK ON STOMACH, CRAMPS    REVIEW OF SYSTEMS:  CONSTITUTIONAL: No fever, fatigue or weakness.  EYES: No blurred or double vision.  EARS, NOSE, AND THROAT: No tinnitus or ear pain.  RESPIRATORY: No cough, shortness of breath, wheezing or hemoptysis.  CARDIOVASCULAR: chest Pain, shortness of breath.  GASTROINTESTINAL: No nausea, vomiting, diarrhea or abdominal pain.  GENITOURINARY: No dysuria, hematuria.  ENDOCRINE: No polyuria, nocturia,  HEMATOLOGY: No anemia, easy bruising or bleeding SKIN: No rash or lesion. MUSCULOSKELETAL: No joint pain or arthritis.   NEUROLOGIC: No tingling, numbness,  weakness.  PSYCHIATRY: No anxiety or depression.   MEDICATIONS AT HOME:   Prior to Admission medications   Medication Sig Start Date End Date Taking? Authorizing Provider  aspirin EC 81 MG tablet Take 81 mg by mouth.   Yes [provider]  Cholecalciferol (VITAMIN D3) 5000 units TABS Take by mouth.   Yes [provider]  lisinopril-hydrochlorothiazide (PRINZIDE,ZESTORETIC) 10-12.5 MG tablet Take 1 tablet by mouth daily. 06/17/16  Yes [provider]  meloxicam (MOBIC) 15 MG tablet Take 15 mg by mouth daily. 06/17/16  Yes [provider]  metFORMIN (GLUCOPHAGE) 500 MG tablet Take 500 mg by mouth 2 (two) times daily with a meal.   Yes [provider]   metoprolol succinate (TOPROL-XL) 25 MG 24 hr tablet Take 25 mg by mouth daily. 06/17/16  Yes [provider]  omeprazole (PRILOSEC) 40 MG capsule Take 40 mg by mouth daily. 06/17/16  Yes [provider]  Oxycodone HCl 10 MG TABS Take 1 tablet (10 mg total) by mouth every 6 (six) hours as needed (PAIN). Patient taking differently: Take 10 mg by mouth every 8 (eight) hours.  03/24/17  Yes Auburn BilberryPatel, Shreyang, MD  QUEtiapine (SEROQUEL) 100 MG tablet Take 100 mg by mouth at bedtime.    Yes [provider]  rOPINIRole (REQUIP) 1 MG tablet Take 1 mg by mouth at bedtime. 06/17/16  Yes [provider]  tamsulosin (FLOMAX) 0.4 MG CAPS capsule Take 0.4 mg by mouth daily. 03/07/17  Yes [provider]  tiZANidine (ZANAFLEX) 4 MG tablet Take 4-8 mg by mouth 3 (three) times daily as needed for muscle spasms.   Yes [provider]  SPIRIVA RESPIMAT 1.25 MCG/ACT AERS INHALE 2 PUFFS BY MOUTH DAILY 05/20/16   [provider]      VITAL SIGNS:  Blood pressure (!) 144/107, pulse 88, temperature 98.4 F (36.9 C), temperature source Oral, resp. rate 15, height 6\' 2"  (1.88 m), weight (!) 156.5 kg (345 lb), SpO2 95 %.  PHYSICAL EXAMINATION:  GENERAL:  51 y.o.-year-old patient lying in the bed with no acute distress.  EYES: Pupils equal, round, reactive to light . No scleral icterus. Extraocular muscles intact.  HEENT: Head atraumatic, normocephalic. Oropharynx and nasopharynx clear.  NECK:  Supple, no jugular venous distention. No thyroid enlargement, no tenderness.  LUNGS: Normal breath sounds bilaterally, no wheezing, rales,rhonchi or crepitation. No use of accessory muscles of respiration.  CARDIOVASCULAR: S1, S2 normal. No murmurs, rubs, or gallops.  Has costochondral tenderness present. ABDOMEN: Soft, nontender, nondistended. Bowel sounds present. No organomegaly or mass.  EXTREMITIES: No pedal edema, cyanosis, or clubbing.  NEUROLOGIC: Cranial nerves II  through XII are intact. Muscle strength 5/5 in all extremities. Sensation intact. Gait not checked.  PSYCHIATRIC: The patient is alert and oriented x 3.  SKIN: No obvious rash, lesion, or ulcer.   LABORATORY PANEL:   CBC Recent Labs  Lab 09/10/17 1431  WBC 11.5*  HGB 14.9  HCT 44.8  PLT 305   ------------------------------------------------------------------------------------------------------------------  Chemistries  Recent Labs  Lab 09/10/17 1651  NA 134*  K 4.6  CL 97*  CO2 29  GLUCOSE 208*  BUN 16  CREATININE 0.93  CALCIUM 9.1   ------------------------------------------------------------------------------------------------------------------  Cardiac Enzymes Recent Labs  Lab 09/10/17 1651  TROPONINI <0.03   ------------------------------------------------------------------------------------------------------------------  RADIOLOGY:  Dg Chest 2 View  Result Date: 09/10/2017 CLINICAL DATA:  COPD, hypertension, sudden onset of chest pain and shortness of breath EXAM: CHEST  2 VIEW COMPARISON:  03/23/2017  FINDINGS: Upper normal heart size. Mediastinal contours and pulmonary vascularity normal. Minimal chronic peribronchial thickening. No acute infiltrate, pleural effusion or pneumothorax. Bones unremarkable. IMPRESSION: Minimal chronic bronchitic changes without infiltrate. Electronically Signed   By: Ulyses Southward M.D.   On: 09/10/2017 15:17    EKG:   Orders placed or performed during the hospital encounter of 09/10/17  . ED EKG within 10 minutes  . ED EKG within 10 minutes  . EKG 12-Lead  . EKG 12-Lead  EKG shows sinus rhythm at 94 bpm, no ST-T changes.  IMPRESSION AND PLAN:  51 year old male patient with history of essential hypertension, diabetes mellitus type 2, BPH, chronic pain syndrome, morbid obesity, sleep apnea but does not use CPAP at home comes in because of sudden onset of shortness of breath, left-sided chest pain. 1.  Sudden onset of shortness  of breath, left-sided chest pain likely secondary to anxiety, sudden exposure of cold air and loss of heat and using kerosene heater also causing the symptoms associated with headache.  But patient, patient's mother wanted him to stay and have cardiology consult, possible cardiac catheterization just to make sure there is no problem with his coronaries.  2.  BPH: Continue Flomax  3.  Diabetes mellitus type 2: Patient is on metformin, continue with diet and 4.  Essential hypertension, blood pressure is uncontrolled with blood pressure 144/107 when he came.  Continue his metoprolol, ACE inhibitors ,HCTZ.   All the records are reviewed and case discussed with ED provider. Management plans discussed with the patient, family and they are in agreement.  CODE STATUS: full  TOTAL TIME TAKING CARE OF THIS PATIENT: 55 minutes.    Katha Hamming M.D on 09/10/2017 at 8:05 PM  Between 7am to 6pm - Pager - [phone]  After 6pm go to [email or web address] - password [email or web address]  [facility]  Office  [phone]  CC: Primary care physician; System, Pcp Not In  Note: This dictation was prepared with Dragon dictation along with smaller phrase technology. Any transcriptional errors that result from this process are unintentional.

## 2017-09-10 NOTE — ED Provider Notes (Signed)
[facility] Emergency Department Provider Note  ____________________________________________   First MD Initiated Contact with Patient 09/10/17 1516     (approximate)  I have reviewed the triage vital signs and the nursing notes.   HISTORY  Chief Complaint Chest Pain   HPI Micheal Bowman is a 51 y.o. male with a history of recurrent chest pain as well as COPD who is presenting to the emergency department today with chest pain as well as shortness of breath.  He says that he had a chest aching that started yesterday in the evening time.  However, he says that the pain increased today when he was clearing snow off his car.  He says the pain is now a 7 out of 10 and feels like someone shoving a bar through his chest.  The pain is also radiating down his left arm to his left elbow.  The pain is associated with shortness of breath.  He has had similar episodes in the past with activity.  He has had multiple admissions.  He had a stress test this past June which did not show any ischemia.  Also with a recent admission in October with a normal echocardiogram as well as 3- troponins at [facility].  He says that this pain is identical to previous episodes.  Says that he is not feeling stressed or panicked at this time.  There is also concern for possible carbon monoxide poisoning as the patient had been using a kerosene heater in his home.  He said that carbon monoxide testing was done by first responders which came back negative except in the front room in his house with the patient and his partner do not spend any time.  The patient says that he is complaining of a mild headache.  However, he says that this headache started before he started his kerosene heater this morning.  He said that the headache did not worsen after starting a kerosene heater.  His partner also describes a headache but says that she gets off-and-on headaches.  She also does not report a headache worsening  after the kerosene heater being turned on.  Patient states that the chest pain is left-sided.  Associated with nausea but no vomiting.  Also says that he was diaphoretic.  He took 3 nitro without any relief.  Also 324 mg of aspirin without relief.  Pain does not worsen with movement of left upper extremity.  Past Medical History:  Diagnosis Date  . Arthritis   . Chronic pain   . COPD (chronic obstructive pulmonary disease) (HCC)   . Diverticulitis   . Fatty liver   . Hypertension     Patient Active Problem List   Diagnosis Date Noted  . Chest pain 03/23/2017  . Essential tremor 07/06/2016    Past Surgical History:  Procedure Laterality Date  . APPENDECTOMY    . BACK SURGERY    . CHOLECYSTECTOMY    . NASAL SINUS SURGERY    . SHOULDER SURGERY Right     Prior to Admission medications   Medication Sig Start Date End Date Taking? Authorizing Provider  aspirin EC 81 MG tablet Take 81 mg by mouth.    [provider]  Cholecalciferol (VITAMIN D3) 5000 units TABS Take by mouth.    [provider]  citalopram (CELEXA) 40 MG tablet Take 40 mg by mouth daily. 03/05/17   [provider]  docusate sodium (COLACE) 100 MG capsule Take 100 mg by mouth every other  day.     [provider]  fluticasone (FLONASE) 50 MCG/ACT nasal spray Place 2 sprays into both nostrils daily. 05/03/16   [provider]  lisinopril-hydrochlorothiazide (PRINZIDE,ZESTORETIC) 10-12.5 MG tablet Take 1 tablet by mouth daily. 06/17/16   [provider]  meloxicam (MOBIC) 15 MG tablet Take 15 mg by mouth daily. 06/17/16   [provider]  metoprolol succinate (TOPROL-XL) 25 MG 24 hr tablet Take 25 mg by mouth daily. 06/17/16   [provider]  omeprazole (PRILOSEC) 40 MG capsule Take 40 mg by mouth daily. 06/17/16   [provider]  Oxycodone HCl 10 MG TABS Take 1 tablet (10 mg total) by mouth every 6 (six) hours as needed (PAIN). 03/24/17   Auburn BilberryPatel,  Shreyang, MD  primidone (MYSOLINE) 50 MG tablet Take 1 and 1/2 tablets at bedtime. (75 mg total) Patient not taking: Reported on 03/23/2017 09/06/16   Nita SicklePatel, Donika K, DO  QUEtiapine (SEROQUEL) 100 MG tablet Take 200 mg by mouth at bedtime.    [provider]  rOPINIRole (REQUIP) 1 MG tablet Take 1 mg by mouth at bedtime. 06/17/16   [provider]  SPIRIVA RESPIMAT 1.25 MCG/ACT AERS INHALE 2 PUFFS BY MOUTH DAILY 05/20/16   [provider]  tamsulosin (FLOMAX) 0.4 MG CAPS capsule Take 0.4 mg by mouth daily. 03/07/17   [provider]  traZODone (DESYREL) 100 MG tablet Take 100 mg by mouth at bedtime as needed for sleep. 03/06/17   [provider]    Allergies Gabapentin; Hydromorphone; Tylenol [acetaminophen]; Celecoxib; Codeine; and Ibuprofen  Family History  Problem Relation Age of Onset  . Diabetes Mother   . Heart disease Father   . Cancer Sister   . Cancer Sister     Social History Social History   Tobacco Use  . Smoking status: Former Smoker    Years: 35.00    Types: Cigars    Last attempt to quit: 07/06/2014    Years since quitting: 3.1  . Smokeless tobacco: Current User    Types: Snuff  Substance Use Topics  . Alcohol use: No    Comment: beer every 3 months  . Drug use: No    Review of Systems  Constitutional: No fever/chills Eyes: No visual changes. ENT: No sore throat. Cardiovascular: As above Respiratory: As above  gastrointestinal: No abdominal pain.  No nausea, no vomiting.  No diarrhea.  No constipation. Genitourinary: Negative for dysuria. Musculoskeletal: Negative for back pain. Skin: Negative for rash. Neurological: Negative for focal weakness or numbness.   ____________________________________________   PHYSICAL EXAM:  VITAL SIGNS: ED Triage Vitals  Enc Vitals Group     BP 09/10/17 1431 132/83     Pulse Rate 09/10/17 1431 92     Resp 09/10/17 1431 18     Temp 09/10/17 1431 98.4 F (36.9 C)     Temp  Source 09/10/17 1431 Oral     SpO2 09/10/17 1431 94 %     Weight 09/10/17 1433 (!) 345 lb (156.5 kg)     Height 09/10/17 1433 6\' 2"  (1.88 m)     Head Circumference --      Peak Flow --      Pain Score 09/10/17 1554 7     Pain Loc --      Pain Edu? --      Excl. in GC? --     Constitutional: Alert and oriented. Well appearing and in no acute distress. Eyes: Conjunctivae are normal.  Head:  Atraumatic. Nose: No congestion/rhinnorhea. Mouth/Throat: Mucous membranes are moist.  Neck: No stridor.   Cardiovascular: Normal rate, regular rhythm. Grossly normal heart sounds.  Chest pain is not reproducible to palpation. Respiratory: Normal respiratory effort.  No retractions. Lungs CTAB. Gastrointestinal: Soft and nontender. No distention.  Musculoskeletal: No lower extremity tenderness nor edema.  No joint effusions. Neurologic:  Normal speech and language. No gross focal neurologic deficits are appreciated. Skin:  Skin is warm, dry and intact. No rash noted. Psychiatric: Mood and affect are normal. Speech and behavior are normal.  ____________________________________________   LABS (all labs ordered are listed, but only abnormal results are displayed)  Labs Reviewed  BASIC METABOLIC PANEL - Abnormal; Notable for the following components:      Result Value   Sodium 133 (*)    Potassium 6.1 (*)    Chloride 96 (*)    Glucose, Bld 256 (*)    All other components within normal limits  CBC - Abnormal; Notable for the following components:   WBC 11.5 (*)    RDW 16.2 (*)    All other components within normal limits  COOXEMETRY PANEL - Abnormal; Notable for the following components:   Carboxyhemoglobin 2.2 (*)    All other components within normal limits  BASIC METABOLIC PANEL - Abnormal; Notable for the following components:   Sodium 134 (*)    Chloride 97 (*)    Glucose, Bld 208 (*)    All other components within normal limits  TROPONIN I  BLOOD GAS, VENOUS  TROPONIN I    CARBOXYHEMOGLOBIN - COOX   ____________________________________________  EKG  ED ECG REPORT I, Arelia Longest, the attending physician, personally viewed and interpreted this ECG.   Date: 09/10/2017  EKG Time: 1430  Rate: 94  Rhythm: normal sinus rhythm  Axis: Normal  Intervals:none  ST&T Change: No ST segment elevation or depression.  No abnormal T wave inversion.  ____________________________________________  RADIOLOGY  Minimal chronic bronchitic change without infiltrate on chest x-ray ____________________________________________   PROCEDURES  Procedure(s) performed:   Procedures  Critical Care performed:   ____________________________________________   INITIAL IMPRESSION / ASSESSMENT AND PLAN / ED COURSE  Pertinent labs & imaging results that were available during my care of the patient were reviewed by me and considered in my medical decision making (see chart for details).  Differential diagnosis includes, but is not limited to, ACS, aortic dissection, pulmonary embolism, cardiac tamponade, pneumothorax, pneumonia, pericarditis, myocarditis, GI-related causes including esophagitis/gastritis, and musculoskeletal chest wall pain.   Differential includes, but is not limited to, viral syndrome, bronchitis including COPD exacerbation, pneumonia, reactive airway disease including asthma, CHF including exacerbation with or without pulmonary/interstitial edema, pneumothorax, ACS, thoracic trauma, and pulmonary embolism.  As part of my medical decision making, I reviewed the following data within the electronic MEDICAL RECORD NUMBER Old chart reviewed and Notes from prior ED visits   Unlikely to be PE.  Multiple episodes in the past which have been similar without right heart strain on the EKG.  Not tachycardic or hypoxic.  Normal troponin.  Elevation in carboxyhemoglobin is very mild and the patient does not appear to be having any symptoms that have worsened, except the  chest pain, since starting the kerosene heater.  Also, because of the previous episodes that have been unrelated to the possibility of carbon monoxide poisoning, it lessens the probability that this episode which is identical to previous, is now caused by carbon monoxide which is elevated at a very low level.     -----------------------------------------  6:54 PM on 09/10/2017 -----------------------------------------  Patient, despite a second negative troponin and multiple recent workups is still complaining of shortness of breath as well as headache.  He says that he does not feel safe going home.  He will be admitted for further observation.  He is understanding of the plan willing to comply.  Signed out to Dr.Konadena of the medicine service.  Unclear cause of the headache with the patient does not have any neuro deficits.  Headache persistent since this morning without worsening does not complain of this headache being the worst headache of his life.  We will continue to observe.  ____________________________________________   FINAL CLINICAL IMPRESSION(S) / ED DIAGNOSES  Chest pain.  Shortness of breath.  Headache.    NEW MEDICATIONS STARTED DURING THIS VISIT:  This SmartLink is deprecated. Use AVSMEDLIST instead to display the medication list for a patient.   Note:  This document was prepared using Dragon voice recognition software and may include unintentional dictation errors.     Myrna BlazerSchaevitz, David Matthew, MD 09/10/17 [phone]

## 2017-09-11 ENCOUNTER — Observation Stay (HOSPITAL_BASED_OUTPATIENT_CLINIC_OR_DEPARTMENT_OTHER)
Admit: 2017-09-11 | Discharge: 2017-09-11 | Disposition: A | Discharge status: Home / Self Care | Payer: Medicaid Other | Attending: Internal Medicine | Admitting: Internal Medicine

## 2017-09-11 DIAGNOSIS — R0789 Other chest pain: Secondary | ICD-10-CM | POA: Diagnosis not present

## 2017-09-11 DIAGNOSIS — R079 Chest pain, unspecified: Secondary | ICD-10-CM

## 2017-09-11 LAB — CBC
HCT: 44.2 % (ref 40.0–52.0)
Hemoglobin: 14.5 g/dL (ref 13.0–18.0)
MCH: 27.5 pg (ref 26.0–34.0)
MCHC: 32.8 g/dL (ref 32.0–36.0)
MCV: 83.9 fL (ref 80.0–100.0)
PLATELETS: 276 10*3/uL (ref 150–440)
RBC: 5.27 MIL/uL (ref 4.40–5.90)
RDW: 16.3 % — ABNORMAL HIGH (ref 11.5–14.5)
WBC: 9.4 10*3/uL (ref 3.8–10.6)

## 2017-09-11 LAB — BASIC METABOLIC PANEL
Anion gap: 12 (ref 5–15)
BUN: 17 mg/dL (ref 6–20)
CALCIUM: 8.9 mg/dL (ref 8.9–10.3)
CHLORIDE: 94 mmol/L — AB (ref 101–111)
CO2: 29 mmol/L (ref 22–32)
CREATININE: 0.87 mg/dL (ref 0.61–1.24)
GFR calc non Af Amer: >60 mL/min (ref >60)
GLUCOSE: 231 mg/dL — AB (ref 65–99)
Potassium: 4 mmol/L (ref 3.5–5.1)
Sodium: 135 mmol/L (ref 135–145)

## 2017-09-11 LAB — HEMOGLOBIN A1C
Hgb A1c MFr Bld: 9.7 % — ABNORMAL HIGH (ref 4.8–5.6)
Mean Plasma Glucose: 231.69 mg/dL

## 2017-09-11 LAB — ECHOCARDIOGRAM COMPLETE
Height: 72 in
Weight: 5440 oz

## 2017-09-11 LAB — GLUCOSE, CAPILLARY
GLUCOSE-CAPILLARY: 227 mg/dL — AB (ref 65–99)
Glucose-Capillary: 255 mg/dL — ABNORMAL HIGH (ref 65–99)
Glucose-Capillary: 263 mg/dL — ABNORMAL HIGH (ref 65–99)

## 2017-09-11 MED ORDER — INSULIN ASPART 100 UNIT/ML ~~LOC~~ SOLN
0.0000 [IU] | Freq: Three times a day (TID) | SUBCUTANEOUS | Status: DC
  Administered 2017-09-11: 8 [IU] via SUBCUTANEOUS
  Administered 2017-09-12 (×2): 5 [IU] via SUBCUTANEOUS
  Administered 2017-09-12: 8 [IU] via SUBCUTANEOUS
  Filled 2017-09-11 (×4): qty 1

## 2017-09-11 MED ORDER — FLUTICASONE PROPIONATE 50 MCG/ACT NA SUSP
2.0000 | Freq: Two times a day (BID) | NASAL | Status: DC
  Administered 2017-09-11 – 2017-09-12 (×2): 2 via NASAL
  Filled 2017-09-11: qty 16

## 2017-09-11 MED ORDER — TRAMADOL HCL 50 MG PO TABS
50.0000 mg | ORAL_TABLET | Freq: Four times a day (QID) | ORAL | Status: DC | PRN
  Administered 2017-09-11 (×2): 50 mg via ORAL
  Filled 2017-09-11 (×2): qty 1

## 2017-09-11 MED ORDER — INSULIN ASPART 100 UNIT/ML ~~LOC~~ SOLN
0.0000 [IU] | Freq: Every day | SUBCUTANEOUS | Status: DC
  Administered 2017-09-11: 2 [IU] via SUBCUTANEOUS
  Filled 2017-09-11: qty 1

## 2017-09-11 NOTE — Progress Notes (Signed)
*  PRELIMINARY RESULTS* Echocardiogram 2D Echocardiogram has been performed.  Cristela BlueHege, Daphene Chisholm 09/11/2017, 11:21 AM

## 2017-09-11 NOTE — Progress Notes (Addendum)
Inpatient Diabetes Program Recommendations  AACE/ADA: New Consensus Statement on Inpatient Glycemic Control (2015)  Target Ranges:  Prepandial:   less than 140 mg/dL      Peak postprandial:   less than 180 mg/dL (1-2 hours)      Critically ill patients:  140 - 180 mg/dL   Results for Micheal Bowman, Micheal Bowman (MRN [id number]) as of 09/11/2017 09:11  Ref. Range 09/10/2017 21:44 09/11/2017 07:24  Glucose-Capillary Latest Ref Range: 65 - 99 mg/dL 409240 (H) 811227 (H)    Admit with: CP/ SOB  History: DM2  Home DM Meds: Metformin 500 mg BID (per review of last PCP note from [facility] Every[facility], patient's dose of Metformin was increased to 1000 mg BID by Dr. Tye MarylandHutchins on 07/31/17)  Current Orders: Metformin 500 mg BID        MD- Please consider the following in-hospital insulin adjustments:  1. Start Novolog Moderate Correction Scale/ SSI (0-15 units) TID AC + HS  2. Per review of last PCP note from [facility], pt's dose of Metformin was increased to 1000 mg BID by Dr. Tye MarylandHutchins on 07/31/17.  May want to increase Metformin to 1000 mg BID.      --Will follow patient during hospitalization--  Ambrose FinlandJeannine Johnston Syrita Dovel RN, MSN, CDE Diabetes Coordinator Inpatient Glycemic Control Team Team Pager: [phone] (8a-5p)

## 2017-09-11 NOTE — Consult Note (Signed)
Cardiology Consultation:   Patient ID: Micheal Bowman; [id number]; 07/30/1966   Admit date: 09/10/2017 Date of Consult: 09/11/2017  Primary Care Provider: System, Pcp Not In Primary Cardiologist: [facility] Primary Electrophysiologist:  n/a   Patient Profile:   Micheal Bowman is a 51 y.o. male with a hx of type 2 diabetes, hypertension, hyperlipidemia, morbid obesity and obstructive sleep apnea who is being seen today for the evaluation of chest pain at the request of Dr. Luberta Mutter.  History of Present Illness:   Micheal Bowman is a 51 year old male with multiple chronic medical conditions including morbid obesity, sleep apnea not on CPAP, type 2 diabetes, hypertension hyperlipidemia who presented with chest pain.  He reports trying to clear the snow off his car yesterday and he started having palpitations with associated substernal chest aching radiating to his left arm.  He reports that exertional chest pain and shortness of breath has been happening more frequently in the recent months. According to the mother, the patient's get depressed because he has been denied disability.  He resorts to eating to improve his mood and he does not do much physical activities. The patient had prior cardiac workup at [facility] including echocardiogram which was unremarkable.  He had heart catheterization done in 2015 which showed normal coronary arteries.  The report was reviewed. He had a nuclear stress test done in June which was normal. The patient ruled out for myocardial infarction and EKG showed no acute changes.  Past Medical History:  Diagnosis Date  . Arthritis   . Chronic pain   . COPD (chronic obstructive pulmonary disease) (HCC)   . Diverticulitis   . Fatty liver   . Hypertension     Past Surgical History:  Procedure Laterality Date  . APPENDECTOMY    . BACK SURGERY    . CHOLECYSTECTOMY    . NASAL SINUS SURGERY    . SHOULDER SURGERY Right      Home Medications:  Prior to Admission medications    Medication Sig Start Date End Date Taking? Authorizing Provider  aspirin EC 81 MG tablet Take 81 mg by mouth.   Yes [provider]  Cholecalciferol (VITAMIN D3) 5000 units TABS Take by mouth.   Yes [provider]  lisinopril-hydrochlorothiazide (PRINZIDE,ZESTORETIC) 10-12.5 MG tablet Take 1 tablet by mouth daily. 06/17/16  Yes [provider]  meloxicam (MOBIC) 15 MG tablet Take 15 mg by mouth daily. 06/17/16  Yes [provider]  metFORMIN (GLUCOPHAGE) 500 MG tablet Take 500 mg by mouth 2 (two) times daily with a meal.   Yes [provider]  metoprolol succinate (TOPROL-XL) 25 MG 24 hr tablet Take 25 mg by mouth daily. 06/17/16  Yes [provider]  omeprazole (PRILOSEC) 40 MG capsule Take 40 mg by mouth daily. 06/17/16  Yes [provider]  Oxycodone HCl 10 MG TABS Take 1 tablet (10 mg total) by mouth every 6 (six) hours as needed (PAIN). Patient taking differently: Take 10 mg by mouth every 8 (eight) hours.  03/24/17  Yes Auburn Bilberry, MD  QUEtiapine (SEROQUEL) 100 MG tablet Take 100 mg by mouth at bedtime.    Yes [provider]  rOPINIRole (REQUIP) 1 MG tablet Take 1 mg by mouth at bedtime. 06/17/16  Yes [provider]  tamsulosin (FLOMAX) 0.4 MG CAPS capsule Take 0.4 mg by mouth daily. 03/07/17  Yes [provider]  tiZANidine (ZANAFLEX) 4 MG tablet Take 4-8 mg by mouth 3 (three) times daily as needed for muscle spasms.  Yes [provider]  SPIRIVA RESPIMAT 1.25 MCG/ACT AERS INHALE 2 PUFFS BY MOUTH DAILY 05/20/16   [provider]    Inpatient Medications: Scheduled Meds: . aspirin EC  81 mg Oral Daily  . docusate sodium  100 mg Oral BID  . enoxaparin (LOVENOX) injection  40 mg Subcutaneous BID  . lisinopril  10 mg Oral Daily   And  . hydrochlorothiazide  12.5 mg Oral Daily  . meloxicam  15 mg Oral Daily  . metFORMIN  500 mg Oral BID WC  . metoprolol succinate  25 mg Oral  Daily  . pantoprazole  40 mg Oral QAC breakfast  . QUEtiapine  100 mg Oral QHS  . rOPINIRole  1 mg Oral QHS  . tamsulosin  0.4 mg Oral Daily   Continuous Infusions:  PRN Meds: bisacodyl, ondansetron **OR** ondansetron (ZOFRAN) IV, oxyCODONE, tiZANidine  Allergies:    Allergies  Allergen Reactions  . Gabapentin Other (See Comments)    Sour stomach, diarreha  . Hydromorphone Other (See Comments)    headache  . Tylenol [Acetaminophen] Other (See Comments)    "makes my stomach cramp"  . Celecoxib Nausea Only  . Codeine Itching  . Ibuprofen Nausea And Vomiting and Other (See Comments)    SICK ON STOMACH, CRAMPS    Social History:   Social History   Socioeconomic History  . Marital status: Single    Spouse name: Not on file  . Number of children: Not on file  . Years of education: Not on file  . Highest education level: Not on file  Social Needs  . Financial resource strain: Not on file  . Food insecurity - worry: Not on file  . Food insecurity - inability: Not on file  . Transportation needs - medical: Not on file  . Transportation needs - non-medical: Not on file  Occupational History  . Not on file  Tobacco Use  . Smoking status: Former Smoker    Years: 35.00    Types: Cigars    Last attempt to quit: 07/06/2014    Years since quitting: 3.1  . Smokeless tobacco: Current User    Types: Snuff  Substance and Sexual Activity  . Alcohol use: No    Comment: beer every 3 months  . Drug use: No  . Sexual activity: Not on file  Other Topics Concern  . Not on file  Social History Narrative   He working on his disability.  He previously did Holiday representative work, last in 2010.   He lives at home with fiance in a [place].   Highest level of education: 9th grade    Family History:    Family History  Problem Relation Age of Onset  . Diabetes Mother   . Heart disease Father   . Cancer Sister   . Cancer Sister      ROS:  Please see the history of present illness.    ROS  All other ROS reviewed and negative.     Physical Exam/Data:   Vitals:   09/10/17 2136 09/10/17 2140 09/11/17 0422 09/11/17 0450  BP: 131/78   120/80  Pulse: 98   92  Resp: 20   20  Temp:    97.9 F (36.6 C)  TempSrc:    Oral  SpO2: 95%   97%  Weight:  (!) 340 lb 3.2 oz (154.3 kg) (!) 340 lb (154.2 kg)   Height:  6' (1.829 m)      Intake/Output Summary (Last 24 hours)  at 09/11/2017 0859 Last data filed at 09/11/2017 0500 Gross per 24 hour  Intake -  Output 300 ml  Net -300 ml   Filed Weights   09/10/17 1433 09/10/17 2140 09/11/17 0422  Weight: (!) 345 lb (156.5 kg) (!) 340 lb 3.2 oz (154.3 kg) (!) 340 lb (154.2 kg)   Body mass index is 46.11 kg/m.  General:  Well nourished, well developed, in no acute distress HEENT: normal Lymph: no adenopathy Neck: no JVD Endocrine:  No thryomegaly Vascular: No carotid bruits; FA pulses 2+ bilaterally without bruits  Cardiac:  normal S1, S2; RRR; no murmur  Lungs:  clear to auscultation bilaterally, no wheezing, rhonchi or rales  Abd: soft, nontender, no hepatomegaly  Ext: no edema Musculoskeletal:  No deformities, BUE and BLE strength normal and equal Skin: warm and dry  Neuro:  CNs 2-12 intact, no focal abnormalities noted Psych:  Normal affect   EKG:  The EKG was personally reviewed and demonstrates: Normal sinus rhythm with no significant ST or T wave changes. Telemetry:  Telemetry was personally reviewed and demonstrates: Normal sinus rhythm with no significant arrhythmia  Relevant CV Studies: [facility].  Laboratory Data:  Chemistry Recent Labs  Lab 09/10/17 1431 09/10/17 1651 09/11/17 0444  NA 133* 134* 135  K 6.1* 4.6 4.0  CL 96* 97* 94*  CO2 27 29 29   GLUCOSE 256* 208* 231*  BUN 15 16 17   CREATININE 0.93 0.93 0.87  CALCIUM 9.2 9.1 8.9  GFRNONAA >60 >60 >60  GFRAA >60 >60 >60  ANIONGAP 10 8 12     No results for input(s): PROT, ALBUMIN, AST, ALT,  ALKPHOS, BILITOT in the last 168 hours. Hematology Recent Labs  Lab 09/10/17 1431 09/11/17 0444  WBC 11.5* 9.4  RBC 5.31 5.27  HGB 14.9 14.5  HCT 44.8 44.2  MCV 84.4 83.9  MCH 28.1 27.5  MCHC 33.3 32.8  RDW 16.2* 16.3*  PLT 305 276   Cardiac Enzymes Recent Labs  Lab 09/10/17 1431 09/10/17 1651  TROPONINI <0.03 <0.03   No results for input(s): TROPIPOC in the last 168 hours.  BNPNo results for input(s): BNP, PROBNP in the last 168 hours.  DDimer No results for input(s): DDIMER in the last 168 hours.  Radiology/Studies:  Dg Chest 2 View  Result Date: 09/10/2017 CLINICAL DATA:  COPD, hypertension, sudden onset of chest pain and shortness of breath EXAM: CHEST  2 VIEW COMPARISON:  03/23/2017 FINDINGS: Upper normal heart size. Mediastinal contours and pulmonary vascularity normal. Minimal chronic peribronchial thickening. No acute infiltrate, pleural effusion or pneumothorax. Bones unremarkable. IMPRESSION: Minimal chronic bronchitic changes without infiltrate. Electronically Signed   By: Ulyses SouthwardMark  Boles M.D.   On: 09/10/2017 15:17    Assessment and Plan:   1. Atypical chest pain with multiple risk factors for coronary artery disease.  The patient ruled out for myocardial infarction with no significant EKG changes.  He did have previous cardiac catheterization in 2015 which showed normal coronary arteries and he had a nuclear stress test in June of this year which was normal.  Based on this, I think the chance of finding obstructive coronary artery disease is overall low.  I suspect that the patient has significant physical deconditioning contributing to his symptoms.  Tachycardia and some form of arrhythmia cannot be completely excluded given that he reported palpitations at this time of chest pain.  I recommend continuing treatment of risk factors.  I discussed with him the  importance of healthy lifestyle changes.  Consider increasing the dose of Toprol to 50 mg daily. 2. Essential  hypertension: Blood pressure is controlled on current medications. 3.  Morbid obesity: I discussed with him the importance of weight loss. 4.  Sleep apnea: He reports inability to tolerate CPAP.   For questions or updates, please contact [facility] Please consult [email or web address] for contact info under Cardiology/STEMI.   Signed, Lorine BearsMuhammad Ermal Haberer, MD  09/11/2017 8:59 AM

## 2017-09-11 NOTE — Progress Notes (Signed)
[facility] - [place] at [facility]   PATIENT NAME: Micheal Bowman    MR#:  [id number]  DATE OF BIRTH:  10/03/1965  SUBJECTIVE:  CHIEF COMPLAINT:   Chief Complaint  Patient presents with  . Chest Pain   -Still complains of substernal chest pain.  Troponins are negative -Also complains of a headache.  REVIEW OF SYSTEMS:  Review of Systems  Constitutional: Negative for chills and fever.  HENT: Negative for congestion, ear discharge, hearing loss and nosebleeds.   Eyes: Negative for blurred vision and double vision.  Respiratory: Positive for shortness of breath. Negative for cough and wheezing.   Cardiovascular: Positive for chest pain. Negative for palpitations and leg swelling.  Gastrointestinal: Negative for abdominal pain, constipation, diarrhea, nausea and vomiting.  Genitourinary: Negative for dysuria.  Musculoskeletal: Negative for myalgias.  Neurological: Positive for headaches. Negative for dizziness, speech change, focal weakness and seizures.  Psychiatric/Behavioral: Negative for depression.    DRUG ALLERGIES:   Allergies  Allergen Reactions  . Gabapentin Other (See Comments)    Sour stomach, diarreha  . Hydromorphone Other (See Comments)    headache  . Tylenol [Acetaminophen] Other (See Comments)    "makes my stomach cramp"  . Celecoxib Nausea Only  . Codeine Itching  . Ibuprofen Nausea And Vomiting and Other (See Comments)    SICK ON STOMACH, CRAMPS    VITALS:  Blood pressure 120/80, pulse 92, temperature 97.9 F (36.6 C), temperature source Oral, resp. rate 20, height 6' (1.829 m), weight (!) 154.2 kg (340 lb), SpO2 97 %.  PHYSICAL EXAMINATION:  Physical Exam  GENERAL:  51 y.o.-year-old obese patient lying in the bed with no acute distress.  EYES: Pupils equal, round, reactive to light and accommodation. No scleral icterus. Extraocular muscles intact.  HEENT: Head atraumatic, normocephalic. Oropharynx and nasopharynx clear.  NECK:   Supple, no jugular venous distention. No thyroid enlargement, no tenderness.  LUNGS: Normal breath sounds bilaterally, no wheezing, rales,rhonchi or crepitation. No use of accessory muscles of respiration.  Diminished breath sounds at the bases CARDIOVASCULAR: S1, S2 normal. No murmurs, rubs, or gallops.  ABDOMEN: Soft, nontender, nondistended. Bowel sounds present. No organomegaly or mass.  EXTREMITIES: No pedal edema, cyanosis, or clubbing.  NEUROLOGIC: Cranial nerves II through XII are intact. Muscle strength 5/5 in all extremities. Sensation intact. Gait not checked.  PSYCHIATRIC: The patient is alert and oriented x 3.  SKIN: No obvious rash, lesion, or ulcer.    LABORATORY PANEL:   CBC Recent Labs  Lab 09/11/17 0444  WBC 9.4  HGB 14.5  HCT 44.2  PLT 276   ------------------------------------------------------------------------------------------------------------------  Chemistries  Recent Labs  Lab 09/11/17 0444  NA 135  K 4.0  CL 94*  CO2 29  GLUCOSE 231*  BUN 17  CREATININE 0.87  CALCIUM 8.9   ------------------------------------------------------------------------------------------------------------------  Cardiac Enzymes Recent Labs  Lab 09/10/17 1651  TROPONINI <0.03   ------------------------------------------------------------------------------------------------------------------  RADIOLOGY:  Dg Chest 2 View  Result Date: 09/10/2017 CLINICAL DATA:  COPD, hypertension, sudden onset of chest pain and shortness of breath EXAM: CHEST  2 VIEW COMPARISON:  03/23/2017 FINDINGS: Upper normal heart size. Mediastinal contours and pulmonary vascularity normal. Minimal chronic peribronchial thickening. No acute infiltrate, pleural effusion or pneumothorax. Bones unremarkable. IMPRESSION: Minimal chronic bronchitic changes without infiltrate. Electronically Signed   By: Ulyses SouthwardMark  Boles M.D.   On: 09/10/2017 15:17    EKG:   Orders placed or performed during the  hospital encounter of 09/10/17  . ED EKG  within 10 minutes  . ED EKG within 10 minutes  . EKG 12-Lead  . EKG 12-Lead    ASSESSMENT AND PLAN:   51 year old obese male with past medical history significant for hypertension, obesity, diabetes mellitus, obstructive sleep apnea not on CPAP presents to hospital secondary to exertional chest pain.  1.  Chest pain-likely atypical.  Prior cardiac catheterization in 2015 showing normal coronaries. -Stress test in June was normal.  Echocardiogram ordered. -Troponins are negative.  Cardiology consult is appreciated.  No further iliac testing is recommended at this time. -Dietary changes and lifestyle changes recommended for weight loss.   -Will need CPAP for his sleep apnea.  Noncompliant with CPAP use.  2.  Hypertension-seems to be well-controlled at this time -Continue home medications.  Patient on lisinopril, hydrochlorothiazide and on metoprolol.  3.  Diabetes mellitus-currently on metformin and sliding scale insulin.  Will need more strict control of diabetes and dietary changes.  Outpatient follow-up with PCP  4.  Chronic pain syndrome-continue muscle relaxants.  5.  DVT prophylaxis-Lovenox   Unable to get home today due to weather.  Discharge in a.m.   All the records are reviewed and case discussed with Care Management/Social Workerr. Management plans discussed with the patient, family and they are in agreement.  CODE STATUS: full code  TOTAL TIME TAKING CARE OF THIS PATIENT: 37 minutes.   POSSIBLE D/C TOMORROW, DEPENDING ON CLINICAL CONDITION.   Lilyauna Miedema M.D on 09/11/2017 at 9:39 AM  Between 7am to 6pm - Pager - [phone]  After 6pm go to [email or web address] - password [email or web address]  [facility]  Office  [phone]  CC: Primary care physician; System, Pcp Not In

## 2017-09-12 LAB — BLOOD GAS, VENOUS
Patient temperature: 37
pCO2, Ven: 50 mmHg (ref 44.0–60.0)
pH, Ven: 7.41 (ref 7.250–7.430)

## 2017-09-12 LAB — GLUCOSE, CAPILLARY
GLUCOSE-CAPILLARY: 255 mg/dL — AB (ref 65–99)
Glucose-Capillary: 246 mg/dL — ABNORMAL HIGH (ref 65–99)
Glucose-Capillary: 211 mg/dL — ABNORMAL HIGH (ref 65–99)

## 2017-09-12 MED ORDER — BACITRACIN-NEOMYCIN-POLYMYXIN 400-5-5000 EX OINT
TOPICAL_OINTMENT | CUTANEOUS | Status: DC | PRN
  Filled 2017-09-12: qty 1

## 2017-09-12 MED ORDER — METFORMIN HCL 1000 MG PO TABS: 1000.0000 mg | ORAL_TABLET | Freq: Two times a day (BID) | ORAL | 2 refills | Status: AC

## 2017-09-12 MED ORDER — METFORMIN HCL 1000 MG PO TABS: 1000.0000 mg | ORAL_TABLET | Freq: Two times a day (BID) | ORAL | 2 refills | Status: DC

## 2017-09-12 MED ORDER — ONDANSETRON 4 MG PO TBDP: 4.0000 mg | ORAL_TABLET | Freq: Three times a day (TID) | ORAL | 0 refills | Status: DC | PRN

## 2017-09-12 MED ORDER — FLUTICASONE PROPIONATE 50 MCG/ACT NA SUSP: 2.0000 | Freq: Two times a day (BID) | NASAL | 0 refills | Status: DC

## 2017-09-12 MED ORDER — FLUTICASONE PROPIONATE 50 MCG/ACT NA SUSP: 2.0000 | Freq: Two times a day (BID) | NASAL | 0 refills | Status: AC

## 2017-09-12 MED ORDER — GLIPIZIDE 5 MG PO TABS: 2.5000 mg | ORAL_TABLET | Freq: Two times a day (BID) | ORAL | 2 refills | Status: DC

## 2017-09-12 MED ORDER — KETOROLAC TROMETHAMINE 30 MG/ML IJ SOLN
30.0000 mg | Freq: Once | INTRAMUSCULAR | Status: AC
  Administered 2017-09-12: 30 mg via INTRAVENOUS
  Filled 2017-09-12: qty 1

## 2017-09-12 MED ORDER — ONDANSETRON 4 MG PO TBDP: 4.0000 mg | ORAL_TABLET | Freq: Three times a day (TID) | ORAL | 0 refills | Status: AC | PRN

## 2017-09-12 NOTE — Discharge Summary (Signed)
[facility] - [place] at [facility]   PATIENT NAME: Micheal BooksJohnnie Bowman    MR#:  [id number]  DATE OF BIRTH:  06/22/1966  DATE OF ADMISSION:  09/10/2017   ADMITTING PHYSICIAN: Katha HammingSnehalatha Konidena, MD  DATE OF DISCHARGE: 09/12/2017  PRIMARY CARE PHYSICIAN: System, Pcp Not In   ADMISSION DIAGNOSIS:   Shortness of breath [R06.02] Nonintractable headache, unspecified chronicity pattern, unspecified headache type [R51] Chest pain, unspecified type [R07.9]  DISCHARGE DIAGNOSIS:   Active Problems:   Chest pain   SECONDARY DIAGNOSIS:   Past Medical History:  Diagnosis Date  . Arthritis   . Chronic pain   . COPD (chronic obstructive pulmonary disease) (HCC)   . Diverticulitis   . Fatty liver   . Hypertension     HOSPITAL COURSE:   51 year old obese male with past medical history significant for hypertension, obesity, diabetes mellitus, obstructive sleep apnea not on CPAP presents to hospital secondary to exertional chest pain.  1.  Chest pain-atypical.  Prior cardiac catheterization in 2015 showing normal coronaries. -Stress test in June 2018 was normal.  Echocardiogram with normal EF and no wall motion abnormalities. -Troponins are negative.  Cardiology consult is appreciated.  No further cardiac testing is recommended at this time. -Dietary changes and lifestyle changes recommended for weight loss.   -Will need CPAP for his sleep apnea.  Noncompliant with CPAP use.  2.  Hypertension-seems to be well-controlled at this time -Continue home medications.  Patient on lisinopril, hydrochlorothiazide and on metoprolol.  3.  Diabetes mellitus-currently on metformin, hba1c of 9.7.   -Increased the dose of metformin and added glipizide -Will need more strict control of diabetes and dietary changes.   - outpatient follow-up with PCP -As already started having complications from his diabetes with early neuropathy changes and some gastroparesis at times. Zofran as  needed given at discharge.  4.  Chronic pain syndrome-continue muscle relaxants.  5. GERD-on Prilosec  Will be discharged today    DISCHARGE CONDITIONS:   Guarded  CONSULTS OBTAINED:   Cardiology consultation by Dr. Kirke CorinArida  DRUG ALLERGIES:   Allergies  Allergen Reactions  . Gabapentin Other (See Comments)    Sour stomach, diarreha  . Hydromorphone Other (See Comments)    headache  . Tylenol [Acetaminophen] Other (See Comments)    "makes my stomach cramp"  . Celecoxib Nausea Only  . Codeine Itching  . Ibuprofen Nausea And Vomiting and Other (See Comments)    SICK ON STOMACH, CRAMPS   DISCHARGE MEDICATIONS:   Allergies as of 09/12/2017      Reactions   Gabapentin Other (See Comments)   Sour stomach, diarreha   Hydromorphone Other (See Comments)   headache   Tylenol [acetaminophen] Other (See Comments)   "makes my stomach cramp"   Celecoxib Nausea Only   Codeine Itching   Ibuprofen Nausea And Vomiting, Other (See Comments)   SICK ON STOMACH, CRAMPS      Medication List    TAKE these medications   aspirin EC 81 MG tablet Take 81 mg by mouth.   fluticasone 50 MCG/ACT nasal spray Commonly known as:  FLONASE Place 2 sprays into both nostrils 2 (two) times daily.   glipiZIDE 5 MG tablet Commonly known as:  GLUCOTROL Take 0.5 tablets (2.5 mg total) by mouth 2 (two) times daily before a meal.   lisinopril-hydrochlorothiazide 10-12.5 MG tablet Commonly known as:  PRINZIDE,ZESTORETIC Take 1 tablet by mouth daily.   meloxicam 15 MG tablet Commonly known as:  MOBIC Take  15 mg by mouth daily.   metFORMIN 1000 MG tablet Commonly known as:  GLUCOPHAGE Take 1 tablet (1,000 mg total) by mouth 2 (two) times daily with a meal. What changed:    medication strength  how much to take   metoprolol succinate 25 MG 24 hr tablet Commonly known as:  TOPROL-XL Take 25 mg by mouth daily.   omeprazole 40 MG capsule Commonly known as:  PRILOSEC Take 40 mg by mouth  daily.   ondansetron 4 MG disintegrating tablet Commonly known as:  ZOFRAN ODT Take 1 tablet (4 mg total) by mouth every 8 (eight) hours as needed for nausea or vomiting.   Oxycodone HCl 10 MG Tabs Take 1 tablet (10 mg total) by mouth every 6 (six) hours as needed (PAIN). What changed:  when to take this   QUEtiapine 100 MG tablet Commonly known as:  SEROQUEL Take 100 mg by mouth at bedtime.   rOPINIRole 1 MG tablet Commonly known as:  REQUIP Take 1 mg by mouth at bedtime.   SPIRIVA RESPIMAT 1.25 MCG/ACT Aers Generic drug:  Tiotropium Bromide Monohydrate INHALE 2 PUFFS BY MOUTH DAILY   tamsulosin 0.4 MG Caps capsule Commonly known as:  FLOMAX Take 0.4 mg by mouth daily.   tiZANidine 4 MG tablet Commonly known as:  ZANAFLEX Take 4-8 mg by mouth 3 (three) times daily as needed for muscle spasms.   Vitamin D3 5000 units Tabs Take by mouth.        DISCHARGE INSTRUCTIONS:   1. PCP follow-up in 1-2 weeks  DIET:   Cardiac diet and Diabetic diet  ACTIVITY:   Activity as tolerated  OXYGEN:   Home Oxygen: No.  Oxygen Delivery: room air  DISCHARGE LOCATION:   home   If you experience worsening of your admission symptoms, develop shortness of breath, life threatening emergency, suicidal or homicidal thoughts you must seek medical attention immediately by calling 911 or calling your MD immediately  if symptoms less severe.  You Must read complete instructions/literature along with all the possible adverse reactions/side effects for all the Medicines you take and that have been prescribed to you. Take any new Medicines after you have completely understood and accpet all the possible adverse reactions/side effects.   Please note  You were cared for by a hospitalist during your hospital stay. If you have any questions about your discharge medications or the care you received while you were in the hospital after you are discharged, you can call the unit and asked to  speak with the hospitalist on call if the hospitalist that took care of you is not available. Once you are discharged, your primary care physician will handle any further medical issues. Please note that NO REFILLS for any discharge medications will be authorized once you are discharged, as it is imperative that you return to your primary care physician (or establish a relationship with a primary care physician if you do not have one) for your aftercare needs so that they can reassess your need for medications and monitor your lab values.    On the day of Discharge:  VITAL SIGNS:   Blood pressure 137/86, pulse 91, temperature 98.3 F (36.8 C), temperature source Oral, resp. rate 17, height 6' (1.829 m), weight (!) 152.3 kg (335 lb 11.2 oz), SpO2 96 %.  PHYSICAL EXAMINATION:    GENERAL:  51 y.o.-year-old obese patient lying in the bed with no acute distress.  EYES: Pupils equal, round, reactive to light and accommodation. No  scleral icterus. Extraocular muscles intact.  HEENT: Head atraumatic, normocephalic. Oropharynx and nasopharynx clear.  NECK:  Supple, no jugular venous distention. No thyroid enlargement, no tenderness.  LUNGS: Normal breath sounds bilaterally, no wheezing, rales,rhonchi or crepitation. No use of accessory muscles of respiration.  Diminished breath sounds at the bases CARDIOVASCULAR: S1, S2 normal. No murmurs, rubs, or gallops.  ABDOMEN: Soft, nontender, nondistended. Bowel sounds present. No organomegaly or mass.  EXTREMITIES: No pedal edema, cyanosis, or clubbing.  NEUROLOGIC: Cranial nerves II through XII are intact. Muscle strength 5/5 in all extremities. Sensation intact. Gait not checked.  PSYCHIATRIC: The patient is alert and oriented x 3.  SKIN: No obvious rash, lesion, or ulcer.     DATA REVIEW:   CBC Recent Labs  Lab 09/11/17 0444  WBC 9.4  HGB 14.5  HCT 44.2  PLT 276    Chemistries  Recent Labs  Lab 09/11/17 0444  NA 135  K 4.0  CL 94*    CO2 29  GLUCOSE 231*  BUN 17  CREATININE 0.87  CALCIUM 8.9     Microbiology Results  No results found for this or any previous visit.  RADIOLOGY:  No results found.   Management plans discussed with the patient, family and they are in agreement.  CODE STATUS:     Code Status Orders  (From admission, onward)        Start     Ordered   09/10/17 2003  Full code  Continuous     09/10/17 2004    Code Status History    Date Active Date Inactive Code Status Order ID Comments User Context   03/23/2017 18:57 03/24/2017 18:48 Full Code [id number]  Houston Siren, MD Inpatient      TOTAL TIME TAKING CARE OF THIS PATIENT: 38 minutes.    Enid Baas M.D on 09/12/2017 at 9:19 AM  Between 7am to 6pm - Pager - [phone]  After 6pm go to [email or web address] - Social research officer, government  [facility]  Office  [phone]  CC: Primary care physician; System, Pcp Not In   Note: This dictation was prepared with Dragon dictation along with smaller phrase technology. Any transcriptional errors that result from this process are unintentional.

## 2017-09-12 NOTE — Progress Notes (Signed)
Pt complains of feeling like his heart is flipping upside down and when he has this feeling it takes his breath and causes chest pain to left chest. VSS, 95% on RA, no changes on telemetry. MD notified, no new orders. I will continue to assess.

## 2017-09-12 NOTE — Progress Notes (Signed)
Went over discharge instructions with the patient and family including medications and follow-up appointment. Discontinue peripheral IV and telemetry monitor. Awaiting for MD to call back about patient's prescription as it was sent to a different pharmacy.

## 2017-09-12 NOTE — Progress Notes (Signed)
Inpatient Diabetes Program Recommendations  AACE/ADA: New Consensus Statement on Inpatient Glycemic Control (2015)  Target Ranges:  Prepandial:   less than 140 mg/dL      Peak postprandial:   less than 180 mg/dL (1-2 hours)      Critically ill patients:  140 - 180 mg/dL   Lab Results  Component Value Date   GLUCAP 246 (H) 09/12/2017   HGBA1C 9.7 (H) 09/11/2017    Review of Glycemic Control  Results for Jennings BooksGEORGE, Aadon (MRN [id number]) as of 09/12/2017 09:15  Ref. Range 09/10/2017 21:44 09/11/2017 07:24 09/11/2017 16:53 09/11/2017 20:29 09/12/2017 07:21  Glucose-Capillary Latest Ref Range: 65 - 99 mg/dL 562240 (H) 130227 (H) 865255 (H) 263 (H) 246 (H)    History: Type 2  Home DM Meds: Metformin 500 mg BID (per review of last PCP note from Care Everywhere, patient's dose of Metformin was increased to 1000 mg BID by Dr. Tye MarylandHutchins on 07/31/17)  Current Orders: Metformin 500 mg BID, Novolog 0-15 units tid, Novolog 0-5 units qhs  Note indicates patient is likely to go home today- Per review of last PCP note from Care Everywhere, pt's dose of Metformin was increased to 1000 mg BID by Dr. Tye MarylandHutchins on 07/31/17.  May want to increase Metformin to 1000 mg BID. Encourage patient to take it with food to avoid stomach upset.   A1C 9.7% Consider sending the patient home on Farxiga 5mg  qday- follow up with MD post hospitalization  Susette RacerJulie Kaiven Vester, RN, BA, MHA, CDE Diabetes Coordinator Inpatient Diabetes Program  [phone] (Team Pager) [phone] [facility] Office) 09/12/2017 9:21 AM

## 2017-09-18 DIAGNOSIS — R0602 Shortness of breath: Secondary | ICD-10-CM | POA: Insufficient documentation

## 2017-09-18 HISTORY — DX: Shortness of breath: R06.02

## 2017-11-16 DIAGNOSIS — G8929 Other chronic pain: Secondary | ICD-10-CM

## 2017-11-16 DIAGNOSIS — M25561 Pain in right knee: Secondary | ICD-10-CM | POA: Insufficient documentation

## 2017-11-16 HISTORY — DX: Other chronic pain: G89.29

## 2017-12-19 DIAGNOSIS — Z0289 Encounter for other administrative examinations: Secondary | ICD-10-CM | POA: Insufficient documentation

## 2017-12-19 HISTORY — DX: Encounter for other administrative examinations: Z02.89

## 2018-02-21 DIAGNOSIS — F410 Panic disorder [episodic paroxysmal anxiety] without agoraphobia: Secondary | ICD-10-CM | POA: Insufficient documentation

## 2018-02-21 HISTORY — DX: Panic disorder (episodic paroxysmal anxiety): F41.0

## 2018-08-22 DIAGNOSIS — Z9181 History of falling: Secondary | ICD-10-CM | POA: Insufficient documentation

## 2018-08-22 HISTORY — DX: History of falling: Z91.81

## 2019-05-31 IMAGING — CR DG CHEST 2V
1 series · 2 of 2 positions shown · non-contrast
Comparison: 03/23/2017

CLINICAL DATA: COPD, hypertension, sudden onset of chest pain and
shortness of breath

EXAM:
CHEST  2 VIEW

[Series 1: dg chest 2 view · 0.14mm/px · 2 of 2 slices shown]
[im 1/2]
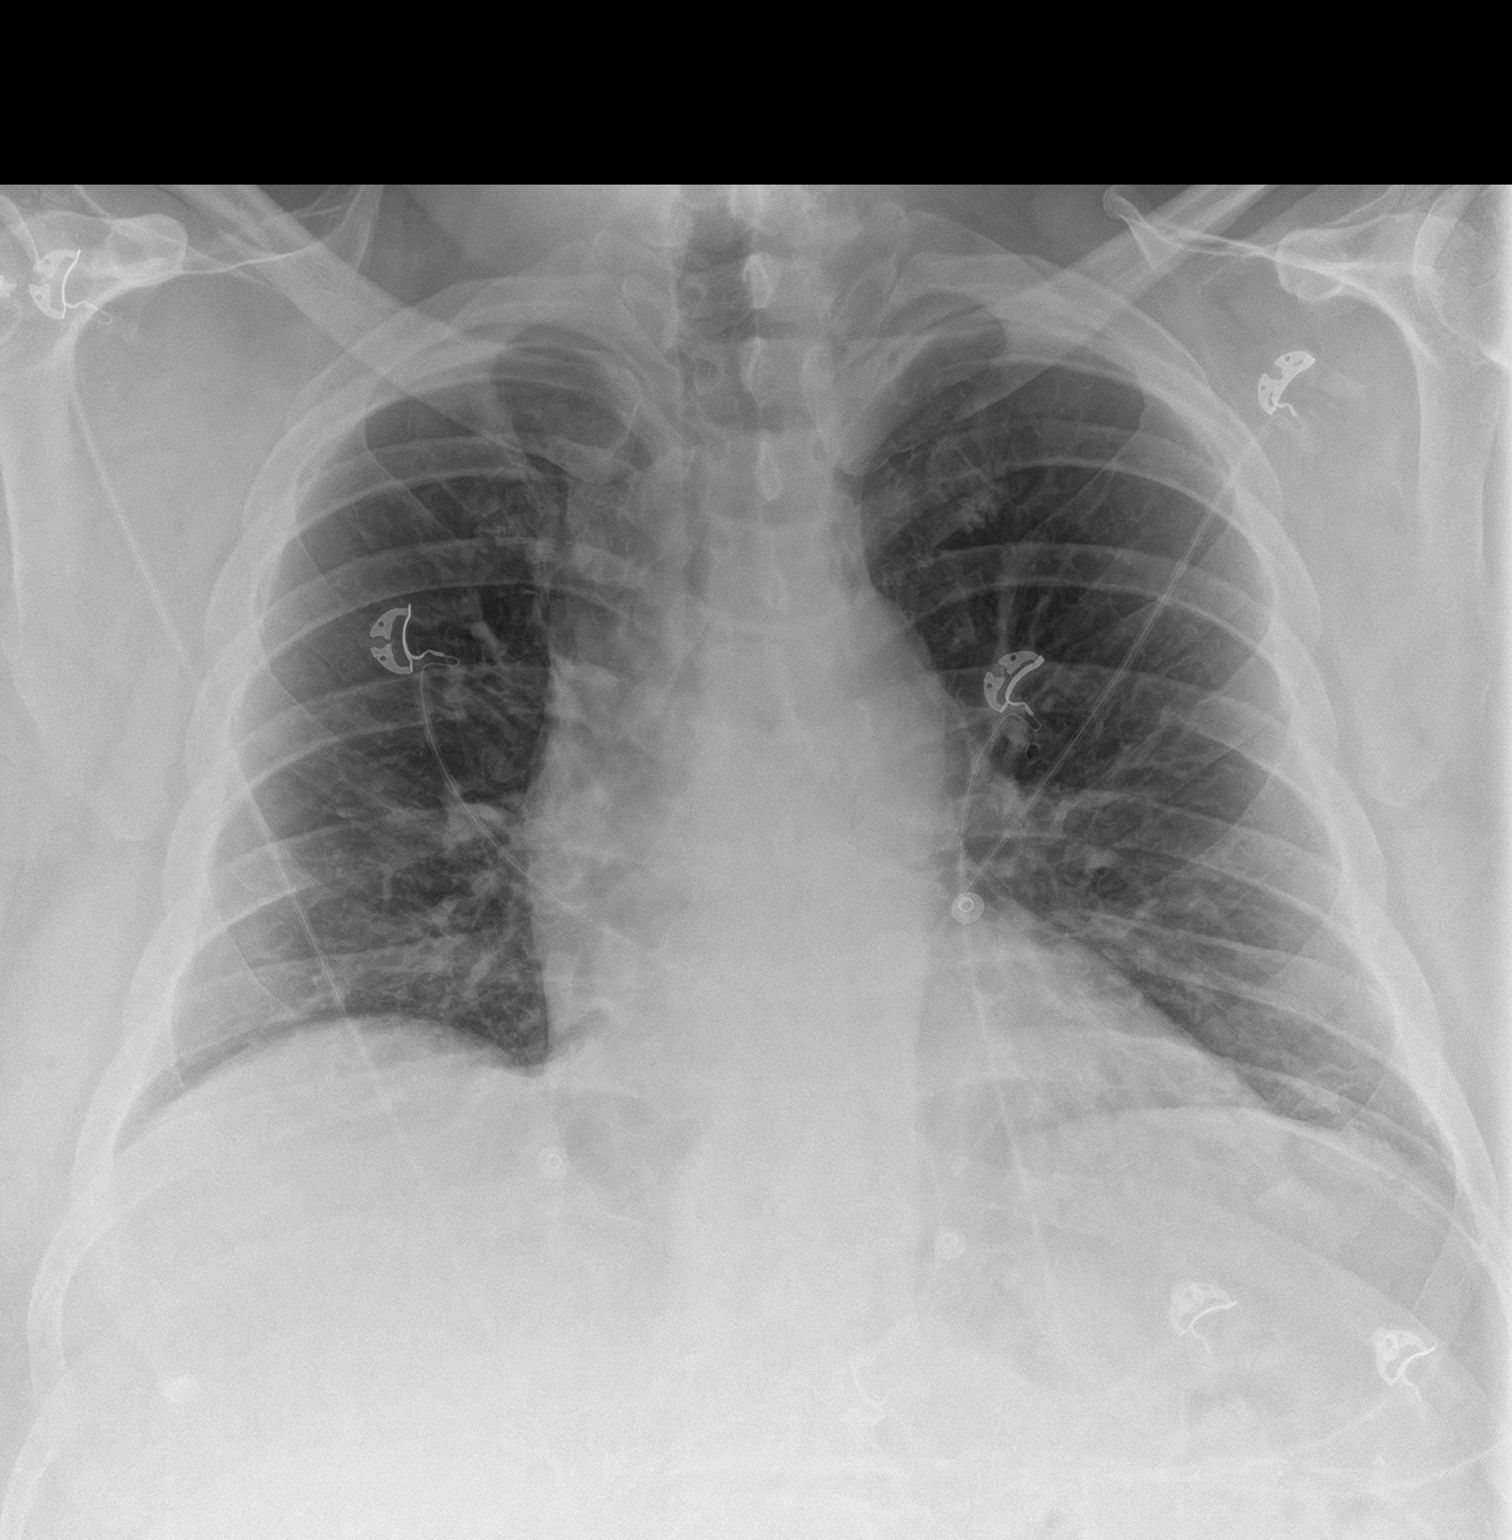
[im 2/2]
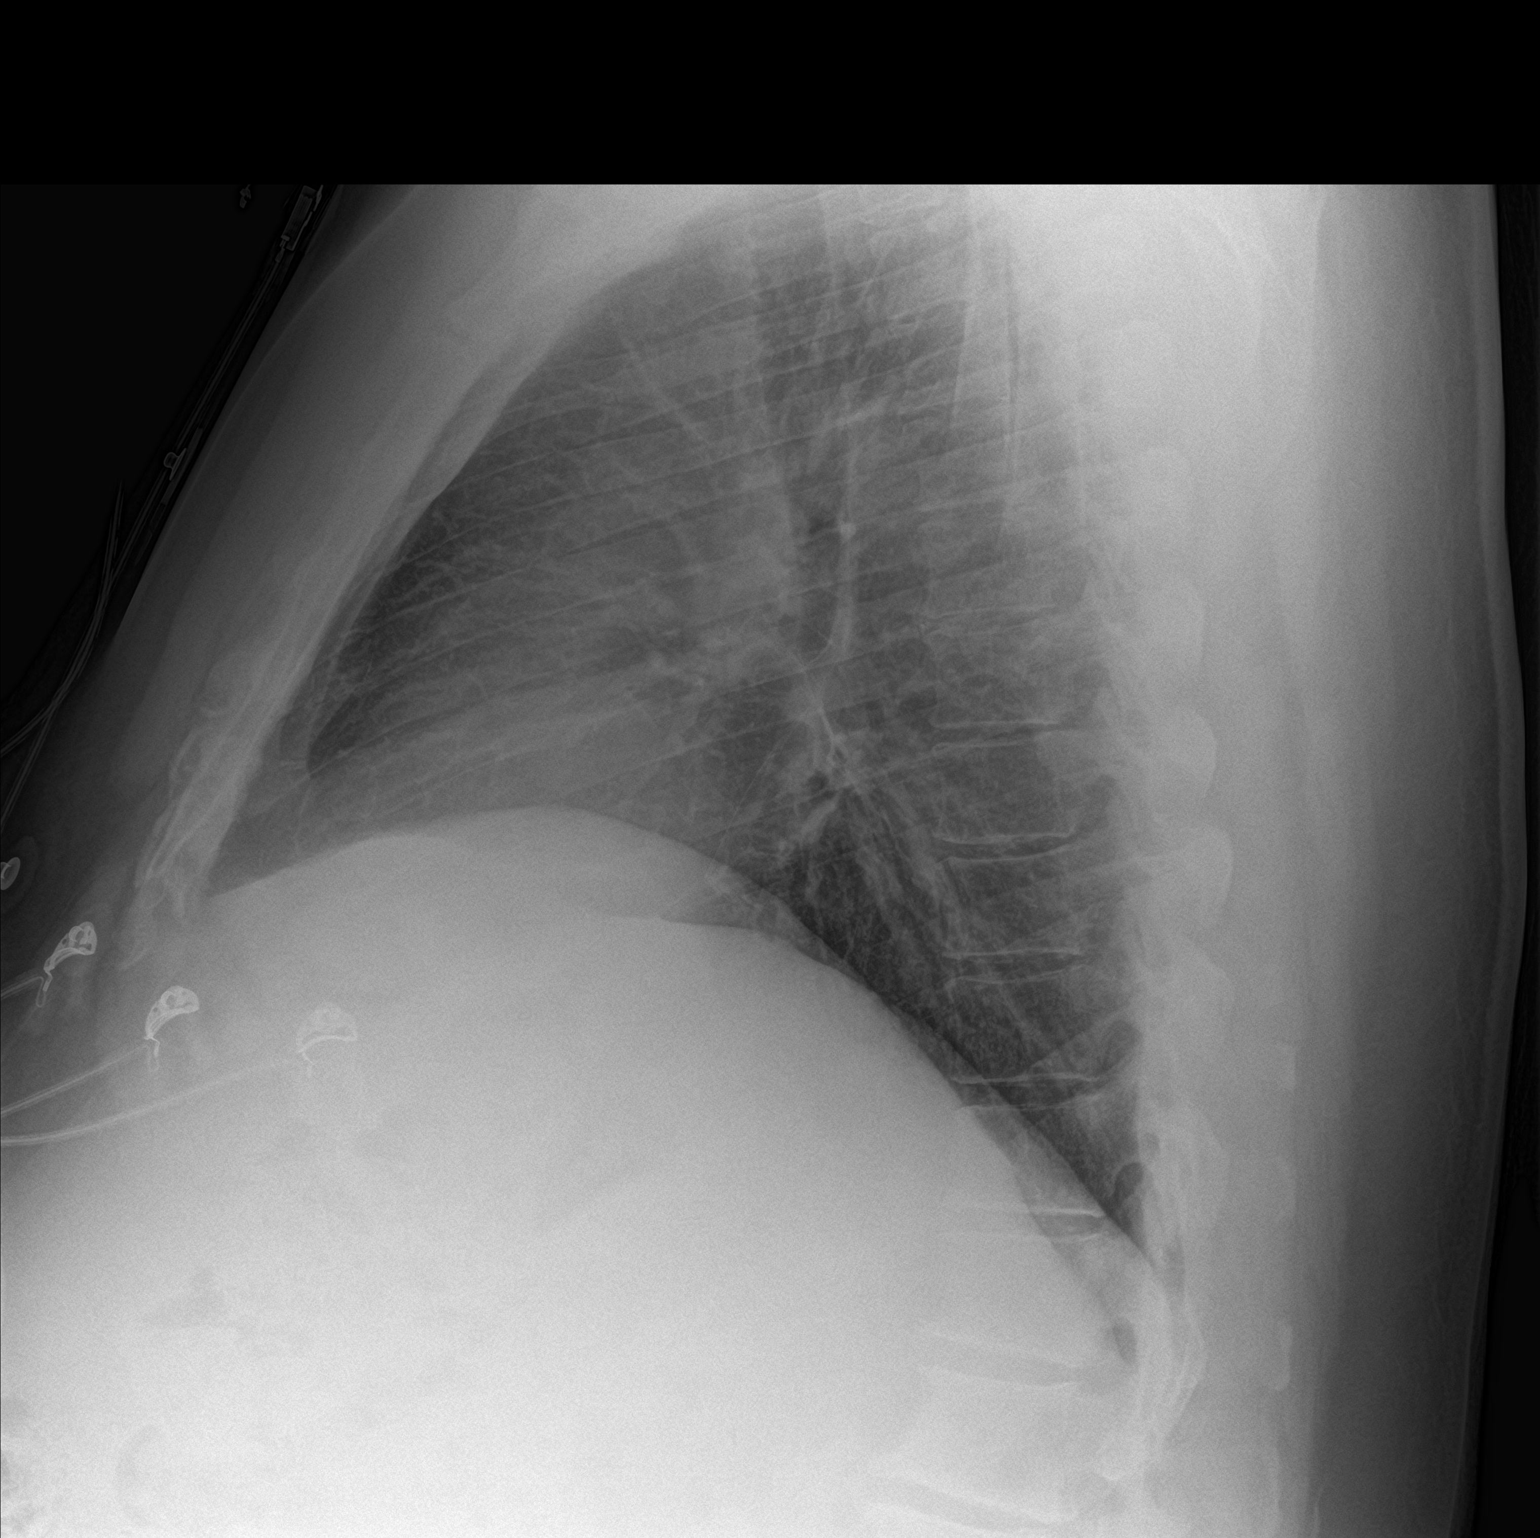

[2 of 2 positions shown; findings below may reference images not displayed]

FINDINGS: Upper normal heart size.

Mediastinal contours and pulmonary vascularity normal.

Minimal chronic peribronchial thickening.

No acute infiltrate, pleural effusion or pneumothorax.

Bones unremarkable.
IMPRESSION: Minimal chronic bronchitic changes without infiltrate.

## 2020-02-12 DIAGNOSIS — J4520 Mild intermittent asthma, uncomplicated: Secondary | ICD-10-CM

## 2020-02-12 HISTORY — DX: Mild intermittent asthma, uncomplicated: J45.20

## 2020-05-07 ENCOUNTER — Telehealth: Payer: Self-pay

## 2020-05-07 NOTE — Telephone Encounter (Signed)
Confirmed and screened for office visit 8/9 

## 2020-05-11 ENCOUNTER — Ambulatory Visit: Payer: Medicaid Other | Admitting: Internal Medicine

## 2020-05-11 ENCOUNTER — Other Ambulatory Visit: Payer: Self-pay

## 2020-05-11 ENCOUNTER — Encounter: Payer: Self-pay | Admitting: Internal Medicine

## 2020-05-11 DIAGNOSIS — Z1211 Encounter for screening for malignant neoplasm of colon: Secondary | ICD-10-CM

## 2020-05-11 DIAGNOSIS — I10 Essential (primary) hypertension: Secondary | ICD-10-CM

## 2020-05-11 DIAGNOSIS — E1165 Type 2 diabetes mellitus with hyperglycemia: Secondary | ICD-10-CM | POA: Diagnosis not present

## 2020-05-11 DIAGNOSIS — G4733 Obstructive sleep apnea (adult) (pediatric): Secondary | ICD-10-CM

## 2020-05-11 DIAGNOSIS — M549 Dorsalgia, unspecified: Secondary | ICD-10-CM

## 2020-05-11 DIAGNOSIS — J449 Chronic obstructive pulmonary disease, unspecified: Secondary | ICD-10-CM

## 2020-05-11 DIAGNOSIS — G8929 Other chronic pain: Secondary | ICD-10-CM

## 2020-05-11 DIAGNOSIS — J4489 Other specified chronic obstructive pulmonary disease: Secondary | ICD-10-CM

## 2020-05-11 LAB — POCT GLYCOSYLATED HEMOGLOBIN (HGB A1C): Hemoglobin A1C: 11.1 % — AB (ref 4.0–5.6)

## 2020-05-11 MED ORDER — ACCU-CHEK AVIVA PLUS VI STRP: ORAL_STRIP | 5 refills | Status: DC

## 2020-05-11 NOTE — Progress Notes (Addendum)
[facility] [address] [place], [place] [address]  Internal MEDICINE  Office Visit Note   Patient Name: Micheal Bowman  [id number]  [id number]  Date of Service: 05/18/2020   Complaints/HPI Pt is here for establishment of PCP. Chief Complaint  Patient presents with  . New Patient (Initial Visit)  . Hypertension  . COPD  . Quality Metric Gaps    covid vaccine and colonscopy   HPI Pt is here with his wife for establishment for pcp. Pt has multiple medical problems  1. C/o elevated blood sugars  2. C/o abdominal pain and constipation. 3. Pt has OSA but does not like using CPAP. 4. Has chronic back pain and sees pain management. 5. COPD. 6. He is not on a statin  Current Medication: Outpatient Encounter Medications as of 05/11/2020  Medication Sig  . albuterol (PROVENTIL) (2.5 MG/3ML) 0.083% nebulizer solution Inhale into the lungs.  Marland Kitchen aspirin EC 81 MG tablet Take 81 mg by mouth.  . Cholecalciferol (VITAMIN D3) 5000 units TABS Take by mouth.  . diclofenac Sodium (VOLTAREN) 1 % GEL Apply topically.  Tery Sanfilippo Sodium (DSS) 100 MG CAPS Take by mouth.  . fluticasone (FLONASE) 50 MCG/ACT nasal spray Place 2 sprays into both nostrils 2 (two) times daily.  Marland Kitchen glipiZIDE (GLUCOTROL) 5 MG tablet TAKE 2 TABLETS (10 MG TOTAL) BY MOUTH EVERY MORNING AND 1 TABLET (5 MG TOTAL) NIGHTLY.  . hydrOXYzine (ATARAX/VISTARIL) 25 MG tablet Take by mouth.  Marland Kitchen lisinopril-hydrochlorothiazide (PRINZIDE,ZESTORETIC) 10-12.5 MG tablet Take 1 tablet by mouth daily.  . meloxicam (MOBIC) 15 MG tablet Take 15 mg by mouth daily.  . metFORMIN (GLUCOPHAGE) 1000 MG tablet Take 1 tablet (1,000 mg total) by mouth 2 (two) times daily with a meal.  . metoprolol succinate (TOPROL-XL) 25 MG 24 hr tablet Take 25 mg by mouth daily.  Marland Kitchen omeprazole (PRILOSEC) 40 MG capsule Take 40 mg by mouth daily.  . ondansetron (ZOFRAN ODT) 4 MG disintegrating tablet Take 1 tablet (4 mg total) by mouth every 8 (eight) hours as  needed for nausea or vomiting.  . Oxycodone HCl 10 MG TABS Take 1 tablet (10 mg total) by mouth every 6 (six) hours as needed (PAIN). (Patient taking differently: Take 10 mg by mouth every 8 (eight) hours. )  . polyethylene glycol powder (GAVILAX) 17 GM/SCOOP powder MIX 17G WITH JUICE OR WATER AND DRINK IT 2 TIMES A DAY  . QUEtiapine (SEROQUEL) 100 MG tablet Take 100 mg by mouth at bedtime.   Marland Kitchen rOPINIRole (REQUIP) 1 MG tablet Take 1 mg by mouth at bedtime.  . tamsulosin (FLOMAX) 0.4 MG CAPS capsule Take 0.4 mg by mouth daily.  . Tiotropium Bromide-Olodaterol (STIOLTO RESPIMAT) 2.5-2.5 MCG/ACT AERS Inhale into the lungs.  . [DISCONTINUED] glucose blood (ACCU-CHEK AVIVA PLUS) test strip 1 each by Other route daily. Use as instructed  . [DISCONTINUED] SPIRIVA RESPIMAT 1.25 MCG/ACT AERS INHALE 2 PUFFS BY MOUTH DAILY  . [DISCONTINUED] tiZANidine (ZANAFLEX) 4 MG tablet Take 4-8 mg by mouth 3 (three) times daily as needed for muscle spasms.  . [DISCONTINUED] glipiZIDE (GLUCOTROL) 5 MG tablet Take 0.5 tablets (2.5 mg total) by mouth 2 (two) times daily before a meal. (Patient taking differently: Take 5 mg by mouth 2 (two) times daily before a meal. )   No facility-administered encounter medications on file as of 05/11/2020.    Surgical History: Past Surgical History:  Procedure Laterality Date  . APPENDECTOMY    . BACK SURGERY    .  CHOLECYSTECTOMY    . NASAL SINUS SURGERY    . SHOULDER SURGERY Right     Medical History: Past Medical History:  Diagnosis Date  . Arthritis   . Chronic pain   . COPD (chronic obstructive pulmonary disease) (HCC)   . Diabetes mellitus without complication (HCC)   . Diverticulitis   . Fatty liver   . Hypertension     Family History: Family History  Problem Relation Age of Onset  . Diabetes Mother   . Heart disease Father   . Cancer Sister   . Cancer Sister     Social History   Socioeconomic History  . Marital status: Single    Spouse name: Not on  file  . Number of children: Not on file  . Years of education: Not on file  . Highest education level: Not on file  Occupational History  . Not on file  Tobacco Use  . Smoking status: Former Smoker    Years: 35.00    Types: Cigars    Quit date: 07/06/2014    Years since quitting: 5.8  . Smokeless tobacco: Current User    Types: Snuff  Substance and Sexual Activity  . Alcohol use: No    Comment: beer every 3 months  . Drug use: No  . Sexual activity: Not on file  Other Topics Concern  . Not on file  Social History Narrative   He working on his disability.  He previously did Holiday representative work, last in 2010.   He lives at home with fiance in a [place].   Highest level of education: 9th grade   Social Determinants of Health   Financial Resource Strain:   . Difficulty of Paying Living Expenses:   Food Insecurity:   . Worried About Programme researcher, broadcasting/film/video in the Last Year:   . Barista in the Last Year:   Transportation Needs:   . Freight forwarder (Medical):   Marland Kitchen Lack of Transportation (Non-Medical):   Physical Activity:   . Days of Exercise per Week:   . Minutes of Exercise per Session:   Stress:   . Feeling of Stress :   Social Connections:   . Frequency of Communication with Friends and Family:   . Frequency of Social Gatherings with Friends and Family:   . Attends Religious Services:   . Active Member of Clubs or Organizations:   . Attends Banker Meetings:   Marland Kitchen Marital Status:   Intimate Partner Violence:   . Fear of Current or Ex-Partner:   . Emotionally Abused:   Marland Kitchen Physically Abused:   . Sexually Abused:     Review of Systems  Constitutional: Negative for chills, fatigue and unexpected weight change.  HENT: Negative for congestion, postnasal drip, rhinorrhea, sneezing and sore throat.   Eyes: Negative for redness.  Respiratory: Negative for cough, chest tightness and shortness of breath.   Cardiovascular: Negative for chest pain and  palpitations.  Gastrointestinal: Negative for abdominal pain, constipation, diarrhea, nausea and vomiting.  Genitourinary: Negative for dysuria and frequency.  Musculoskeletal: Negative for arthralgias, back pain, joint swelling and neck pain.  Skin: Negative for rash.  Neurological: Negative.  Negative for tremors and numbness.  Hematological: Negative for adenopathy. Does not bruise/bleed easily.  Psychiatric/Behavioral: Negative for behavioral problems (Depression), sleep disturbance and suicidal ideas. The patient is not nervous/anxious.     Vital Signs: BP 138/90   Pulse 87   Temp 97.9 F (36.6 C)   Resp  16   Ht 6' (1.829 m)   Wt (!) 331 lb (150.1 kg)   SpO2 96%   BMI 44.89 kg/m    Physical Exam Constitutional:      General: He is not in acute distress.    Appearance: He is well-developed. He is not diaphoretic.  HENT:     Head: Normocephalic and atraumatic.     Mouth/Throat:     Pharynx: No oropharyngeal exudate.  Eyes:     Pupils: Pupils are equal, round, and reactive to light.  Neck:     Thyroid: No thyromegaly.     Vascular: No JVD.     Trachea: No tracheal deviation.  Cardiovascular:     Rate and Rhythm: Normal rate and regular rhythm.     Heart sounds: Normal heart sounds. No murmur heard.  No friction rub. No gallop.   Pulmonary:     Effort: Pulmonary effort is normal. No respiratory distress.     Breath sounds: No wheezing or rales.  Chest:     Chest wall: No tenderness.  Abdominal:     General: Bowel sounds are normal.     Palpations: Abdomen is soft.  Musculoskeletal:        General: Normal range of motion.     Cervical back: Normal range of motion and neck supple.  Lymphadenopathy:     Cervical: No cervical adenopathy.  Skin:    General: Skin is warm and dry.  Neurological:     Mental Status: He is alert and oriented to person, place, and time.     Cranial Nerves: No cranial nerve deficit.  Psychiatric:        Behavior: Behavior normal.         Thought Content: Thought content normal.        Judgment: Judgment normal.    Assessment/Plan: 1. Uncontrolled type 2 diabetes mellitus with hyperglycemia (HCC) - Samples of long acting insulin ( Basaglar  is given to start with 20 units at 9:00 pm , samples of steglujan 5/100 mg po qd Is given as well  - glipiZIDE (GLUCOTROL) 5 MG tablet; TAKE 2 TABLETS (10 MG TOTAL) BY MOUTH EVERY MORNING AND 1 TABLET (5 MG TOTAL) NIGHTLY. - POCT HgB A1C  2. Essential hypertension, benign -Continue current therapy for now, will monitor. DASH DIET   3. COPD with chronic bronchitis (HCC) - Controlled with MDI  4. Obstructive sleep apnea hypopnea, severe - Pt refused CPAP  5. Chronic back pain greater than 3 months duration - Continue care by pain management   6. Encounter for screening colonoscopy - Ambulatory referral to Gastroenterology  General Counseling: Aldan verbalizes understanding of the findings of todays visit and agrees with plan of treatment. I have discussed any further diagnostic evaluation that may be needed or ordered today. We also reviewed his medications today. he has been encouraged to call the office with any questions or concerns that should arise related to todays visit.  Counseling: Diabetes Counseling:  1. Addition of ACE inh/ ARB'S for nephroprotection. Microalbumin is updated  2. Diabetic foot care, prevention of complications. Podiatry consult 3. Exercise and lose weight.  4. Diabetic eye examination, Diabetic eye exam is updated  5. Monitor blood sugar closlely. nutrition counseling.  6. Sign and symptoms of hypoglycemia including shaking sweating,confusion and headaches.    Orders Placed This Encounter  Procedures  . Ambulatory referral to Gastroenterology  . POCT HgB A1C     Time spent: 35 Minutes

## 2020-05-14 ENCOUNTER — Other Ambulatory Visit: Payer: Self-pay

## 2020-05-14 MED ORDER — ACCU-CHEK AVIVA PLUS VI STRP: ORAL_STRIP | 5 refills | Status: DC

## 2020-05-14 MED ORDER — TIZANIDINE HCL 4 MG PO TABS: 4.0000 mg | ORAL_TABLET | Freq: Three times a day (TID) | ORAL | 0 refills | Status: AC | PRN

## 2020-05-25 ENCOUNTER — Telehealth: Payer: Self-pay

## 2020-05-25 ENCOUNTER — Other Ambulatory Visit: Payer: Self-pay

## 2020-05-25 MED ORDER — STEGLUJAN 5-100 MG PO TABS
ORAL_TABLET | ORAL | 0 refills | Status: DC
Start: 2020-05-25 — End: 2020-06-02

## 2020-05-25 NOTE — Telephone Encounter (Signed)
Send message 

## 2020-05-27 ENCOUNTER — Telehealth (INDEPENDENT_AMBULATORY_CARE_PROVIDER_SITE_OTHER): Payer: Self-pay | Admitting: Gastroenterology

## 2020-05-27 ENCOUNTER — Other Ambulatory Visit: Payer: Self-pay

## 2020-05-27 DIAGNOSIS — E662 Morbid (severe) obesity with alveolar hypoventilation: Secondary | ICD-10-CM | POA: Insufficient documentation

## 2020-05-27 DIAGNOSIS — Z1211 Encounter for screening for malignant neoplasm of colon: Secondary | ICD-10-CM

## 2020-05-27 HISTORY — DX: Morbid (severe) obesity with alveolar hypoventilation: E66.2

## 2020-05-27 NOTE — Progress Notes (Signed)
Gastroenterology Pre-Procedure Review  Request Date: Thursday 06/04/20 Requesting Physician: Dr. Tobi Bastos  PATIENT REVIEW QUESTIONS: The patient responded to the following health history questions as indicated:    1. Are you having any GI issues? no 2. Do you have a personal history of Polyps? no 3. Do you have a family history of Colon Cancer or Polyps? yes (father colon cancer) 4. Diabetes Mellitus? boarderline diabetes 5. Joint replacements in the past 12 months?no 6. Major health problems in the past 3 months?no 7. Any artificial heart valves, MVP, or defibrillator?no    MEDICATIONS & ALLERGIES:    Patient reports the following regarding taking any anticoagulation/antiplatelet therapy:   Plavix, Coumadin, Eliquis, Xarelto, Lovenox, Pradaxa, Brilinta, or Effient? no Aspirin?yes  Patient confirms/reports the following medications:  Current Outpatient Medications  Medication Sig Dispense Refill  . albuterol (PROVENTIL) (2.5 MG/3ML) 0.083% nebulizer solution Inhale into the lungs.    Marland Kitchen aspirin EC 81 MG tablet Take 81 mg by mouth.    . Cholecalciferol (VITAMIN D3) 5000 units TABS Take by mouth.    . diclofenac Sodium (VOLTAREN) 1 % GEL Apply topically.    Tery Sanfilippo Sodium (DSS) 100 MG CAPS Take by mouth.    . fluticasone (FLONASE) 50 MCG/ACT nasal spray Place 2 sprays into both nostrils 2 (two) times daily. 16 g 0  . glipiZIDE (GLUCOTROL) 5 MG tablet TAKE 2 TABLETS (10 MG TOTAL) BY MOUTH EVERY MORNING AND 1 TABLET (5 MG TOTAL) NIGHTLY.    Marland Kitchen glucose blood (ACCU-CHEK AVIVA PLUS) test strip Use as instructed to check blood sugar twice daily 100 each 5  . hydrOXYzine (ATARAX/VISTARIL) 25 MG tablet Take by mouth.    . Insulin Glargine (BASAGLAR KWIKPEN) 100 UNIT/ML Inject 20 Units into the skin at bedtime.    Marland Kitchen lisinopril-hydrochlorothiazide (PRINZIDE,ZESTORETIC) 10-12.5 MG tablet Take 1 tablet by mouth daily.  0  . meloxicam (MOBIC) 15 MG tablet Take 15 mg by mouth daily.  0  . metFORMIN  (GLUCOPHAGE) 1000 MG tablet Take 1 tablet (1,000 mg total) by mouth 2 (two) times daily with a meal. 60 tablet 2  . metoprolol succinate (TOPROL-XL) 25 MG 24 hr tablet Take 25 mg by mouth daily.  0  . omeprazole (PRILOSEC) 40 MG capsule Take 40 mg by mouth daily.  2  . ondansetron (ZOFRAN ODT) 4 MG disintegrating tablet Take 1 tablet (4 mg total) by mouth every 8 (eight) hours as needed for nausea or vomiting. 20 tablet 0  . Oxycodone HCl 10 MG TABS Take 1 tablet (10 mg total) by mouth every 6 (six) hours as needed (PAIN). (Patient taking differently: Take 10 mg by mouth every 8 (eight) hours. ) 24 tablet 0  . polyethylene glycol powder (GAVILAX) 17 GM/SCOOP powder MIX 17G WITH JUICE OR WATER AND DRINK IT 2 TIMES A DAY    . QUEtiapine (SEROQUEL) 100 MG tablet Take 100 mg by mouth at bedtime.     Marland Kitchen rOPINIRole (REQUIP) 1 MG tablet Take 1 mg by mouth at bedtime.  0  . tamsulosin (FLOMAX) 0.4 MG CAPS capsule Take 0.4 mg by mouth daily.  2  . Tiotropium Bromide-Olodaterol (STIOLTO RESPIMAT) 2.5-2.5 MCG/ACT AERS Inhale into the lungs.    Marland Kitchen tiZANidine (ZANAFLEX) 4 MG tablet Take 1-2 tablets (4-8 mg total) by mouth 3 (three) times daily as needed for muscle spasms. 180 tablet 0  . atorvastatin (LIPITOR) 20 MG tablet Take 20 mg by mouth daily.    Marland Kitchen buPROPion (WELLBUTRIN XL) 300 MG 24  hr tablet Take 300 mg by mouth daily.    . Ertugliflozin-SITagliptin (STEGLUJAN) 5-100 MG TABS Take 1 tab po daily 30 tablet 0   No current facility-administered medications for this visit.    Patient confirms/reports the following allergies:  Allergies  Allergen Reactions  . Fluticasone-Salmeterol Shortness Of Breath    Swelling of neck and throat--states had to get emergency epinephrine last time used.   . Gabapentin Other (See Comments)    Sour stomach, diarreha  . Hydromorphone Other (See Comments)    headache  . Tylenol [Acetaminophen] Other (See Comments)    "makes my stomach cramp"  . Celecoxib Nausea Only  .  Codeine Itching  . Ibuprofen Nausea And Vomiting and Other (See Comments)    SICK ON STOMACH, CRAMPS    No orders of the defined types were placed in this encounter.   AUTHORIZATION INFORMATION Primary Insurance: 1D#: Group #:  Secondary Insurance: 1D#: Group #:  SCHEDULE INFORMATION: Date: 06/04/20 Time: Location:[facility]

## 2020-05-29 ENCOUNTER — Telehealth: Payer: Self-pay

## 2020-05-29 NOTE — Telephone Encounter (Signed)
Confirmed and screened for 06-02-20 ov. 

## 2020-06-01 ENCOUNTER — Ambulatory Visit: Payer: Medicaid Other | Admitting: Internal Medicine

## 2020-06-01 ENCOUNTER — Telehealth: Payer: Self-pay | Admitting: Gastroenterology

## 2020-06-01 NOTE — Telephone Encounter (Signed)
Call to cancel procedure..Patient wants a call back to reschedule..Clinical staff

## 2020-06-02 ENCOUNTER — Other Ambulatory Visit: Payer: Self-pay | Admitting: Hospice and Palliative Medicine

## 2020-06-02 ENCOUNTER — Encounter: Payer: Self-pay | Admitting: Hospice and Palliative Medicine

## 2020-06-02 ENCOUNTER — Ambulatory Visit: Payer: Medicaid Other | Admitting: Hospice and Palliative Medicine

## 2020-06-02 ENCOUNTER — Other Ambulatory Visit: Payer: Self-pay | Admitting: Internal Medicine

## 2020-06-02 ENCOUNTER — Inpatient Hospital Stay: Admission: RE | Admit: 2020-06-02 | Payer: Medicaid Other | Source: Ambulatory Visit

## 2020-06-02 ENCOUNTER — Other Ambulatory Visit: Payer: Self-pay

## 2020-06-02 VITALS — BP 122/88 | HR 100 | Temp 98.5°F | Resp 16 | Ht 72.0 in | Wt 330.6 lb

## 2020-06-02 DIAGNOSIS — Z135 Encounter for screening for eye and ear disorders: Secondary | ICD-10-CM | POA: Diagnosis not present

## 2020-06-02 DIAGNOSIS — E1165 Type 2 diabetes mellitus with hyperglycemia: Secondary | ICD-10-CM

## 2020-06-02 DIAGNOSIS — Z0001 Encounter for general adult medical examination with abnormal findings: Secondary | ICD-10-CM

## 2020-06-02 DIAGNOSIS — Z125 Encounter for screening for malignant neoplasm of prostate: Secondary | ICD-10-CM

## 2020-06-02 DIAGNOSIS — I10 Essential (primary) hypertension: Secondary | ICD-10-CM

## 2020-06-02 MED ORDER — BASAGLAR KWIKPEN 100 UNIT/ML ~~LOC~~ SOPN
22.0000 [IU] | PEN_INJECTOR | Freq: Every day | SUBCUTANEOUS | 1 refills | Status: DC
Start: 2020-06-02 — End: 2020-06-02

## 2020-06-02 MED ORDER — STEGLUJAN 5-100 MG PO TABS: ORAL_TABLET | ORAL | 0 refills | Status: DC

## 2020-06-02 NOTE — Progress Notes (Signed)
[facility] [address] [place], [place] [address]  Internal MEDICINE  Office Visit Note  Patient Name: Micheal Bowman  [id number]  [id number]  Date of Service: 06/02/2020  Chief Complaint  Patient presents with  . Follow-up    review blood sugar log  . Diabetes  . Hypertension  . Quality Metric Gaps    HepC, foot exam, ophthalmology exam, colonoscopy     HPI Patient is here for routine follow-up 1. Diabetes-has been routinely checking his blood sugars at home, each AM and PM. AM has been averaging 130-150 and PM 150-180. Has been compliant with medications and is slowly incorporating healthy eating habits into his meals. 2. Colonoscopy scheduled for September   Current Medication: Outpatient Encounter Medications as of 06/02/2020  Medication Sig  . albuterol (PROVENTIL) (2.5 MG/3ML) 0.083% nebulizer solution Inhale into the lungs.  Marland Kitchen aspirin EC 81 MG tablet Take 81 mg by mouth.  Marland Kitchen atorvastatin (LIPITOR) 20 MG tablet Take 20 mg by mouth daily.  Marland Kitchen buPROPion (WELLBUTRIN XL) 300 MG 24 hr tablet Take 300 mg by mouth daily.  . Cholecalciferol (VITAMIN D3) 5000 units TABS Take by mouth.  . diclofenac Sodium (VOLTAREN) 1 % GEL Apply topically.  Tery Sanfilippo Sodium (DSS) 100 MG CAPS Take by mouth.  . Ertugliflozin-SITagliptin (STEGLUJAN) 5-100 MG TABS Take 1 tab po daily  . fluticasone (FLONASE) 50 MCG/ACT nasal spray Place 2 sprays into both nostrils 2 (two) times daily.  Marland Kitchen glucose blood (ACCU-CHEK AVIVA PLUS) test strip Use as instructed to check blood sugar twice daily  . hydrOXYzine (ATARAX/VISTARIL) 25 MG tablet Take by mouth.  Marland Kitchen lisinopril-hydrochlorothiazide (PRINZIDE,ZESTORETIC) 10-12.5 MG tablet Take 1 tablet by mouth daily.  . meloxicam (MOBIC) 15 MG tablet Take 15 mg by mouth daily.  . metFORMIN (GLUCOPHAGE) 1000 MG tablet Take 1 tablet (1,000 mg total) by mouth 2 (two) times daily with a meal.  . metoprolol succinate (TOPROL-XL) 25 MG 24 hr tablet Take  25 mg by mouth daily.  Marland Kitchen omeprazole (PRILOSEC) 40 MG capsule Take 40 mg by mouth daily.  . ondansetron (ZOFRAN ODT) 4 MG disintegrating tablet Take 1 tablet (4 mg total) by mouth every 8 (eight) hours as needed for nausea or vomiting.  . Oxycodone HCl 10 MG TABS Take 1 tablet (10 mg total) by mouth every 6 (six) hours as needed (PAIN). (Patient taking differently: Take 10 mg by mouth every 8 (eight) hours. )  . polyethylene glycol powder (GAVILAX) 17 GM/SCOOP powder MIX 17G WITH JUICE OR WATER AND DRINK IT 2 TIMES A DAY  . QUEtiapine (SEROQUEL) 100 MG tablet Take 100 mg by mouth at bedtime.   Marland Kitchen rOPINIRole (REQUIP) 1 MG tablet Take 1 mg by mouth at bedtime.  . tamsulosin (FLOMAX) 0.4 MG CAPS capsule Take 0.4 mg by mouth daily.  . Tiotropium Bromide-Olodaterol (STIOLTO RESPIMAT) 2.5-2.5 MCG/ACT AERS Inhale into the lungs.  Marland Kitchen tiZANidine (ZANAFLEX) 4 MG tablet Take 1-2 tablets (4-8 mg total) by mouth 3 (three) times daily as needed for muscle spasms.  . [DISCONTINUED] Ertugliflozin-SITagliptin (STEGLUJAN) 5-100 MG TABS Take 1 tab po daily  . [DISCONTINUED] Insulin Glargine (BASAGLAR KWIKPEN) 100 UNIT/ML Inject 20 Units into the skin at bedtime.  . [DISCONTINUED] Insulin Glargine (BASAGLAR KWIKPEN) 100 UNIT/ML Inject 0.22 mLs (22 Units total) into the skin at bedtime.  Marland Kitchen glipiZIDE (GLUCOTROL) 5 MG tablet TAKE 2 TABLETS (10 MG TOTAL) BY MOUTH EVERY MORNING AND 1 TABLET (5 MG TOTAL) NIGHTLY. (Patient not taking: Reported  on 06/02/2020)   No facility-administered encounter medications on file as of 06/02/2020.    Surgical History: Past Surgical History:  Procedure Laterality Date  . APPENDECTOMY    . BACK SURGERY    . CHOLECYSTECTOMY    . NASAL SINUS SURGERY    . SHOULDER SURGERY Right     Medical History: Past Medical History:  Diagnosis Date  . Arthritis   . Chronic pain   . COPD (chronic obstructive pulmonary disease) (HCC)   . Diabetes mellitus without complication (HCC)   .  Diverticulitis   . Fatty liver   . Hypertension     Family History: Family History  Problem Relation Age of Onset  . Diabetes Mother   . Heart disease Father   . Cancer Sister   . Cancer Sister     Social History   Socioeconomic History  . Marital status: Single    Spouse name: Not on file  . Number of children: Not on file  . Years of education: Not on file  . Highest education level: Not on file  Occupational History  . Not on file  Tobacco Use  . Smoking status: Former Smoker    Years: 35.00    Types: Cigars    Quit date: 07/06/2014    Years since quitting: 5.9  . Smokeless tobacco: Current User    Types: Snuff  Substance and Sexual Activity  . Alcohol use: No    Comment: beer every 3 months  . Drug use: No  . Sexual activity: Not on file  Other Topics Concern  . Not on file  Social History Narrative   He working on his disability.  He previously did Holiday representative work, last in 2010.   He lives at home with fiance in a [place].   Highest level of education: 9th grade   Social Determinants of Health   Financial Resource Strain:   . Difficulty of Paying Living Expenses: Not on file  Food Insecurity:   . Worried About Programme researcher, broadcasting/film/video in the Last Year: Not on file  . Ran Out of Food in the Last Year: Not on file  Transportation Needs:   . Lack of Transportation (Medical): Not on file  . Lack of Transportation (Non-Medical): Not on file  Physical Activity:   . Days of Exercise per Week: Not on file  . Minutes of Exercise per Session: Not on file  Stress:   . Feeling of Stress : Not on file  Social Connections:   . Frequency of Communication with Friends and Family: Not on file  . Frequency of Social Gatherings with Friends and Family: Not on file  . Attends Religious Services: Not on file  . Active Member of Clubs or Organizations: Not on file  . Attends Banker Meetings: Not on file  . Marital Status: Not on file  Intimate Partner  Violence:   . Fear of Current or Ex-Partner: Not on file  . Emotionally Abused: Not on file  . Physically Abused: Not on file  . Sexually Abused: Not on file    Review of Systems  Constitutional: Negative for chills, diaphoresis, fatigue and unexpected weight change.  HENT: Negative for congestion, rhinorrhea, sinus pressure, sinus pain, sneezing and sore throat.   Eyes: Negative for photophobia and visual disturbance.  Respiratory: Negative for cough, chest tightness and shortness of breath.   Cardiovascular: Negative for chest pain, palpitations and leg swelling.  Gastrointestinal: Negative for abdominal pain, constipation, diarrhea, nausea and  vomiting.  Endocrine: Negative for polydipsia, polyphagia and polyuria.  Genitourinary: Negative for dysuria and frequency.  Musculoskeletal: Negative for arthralgias, back pain, joint swelling and neck pain.  Skin: Negative for color change and rash.  Neurological: Negative for dizziness, tremors, weakness, numbness and headaches.  Hematological: Negative for adenopathy. Does not bruise/bleed easily.  Psychiatric/Behavioral: Negative for behavioral problems (Depression), sleep disturbance and suicidal ideas. The patient is not nervous/anxious.     Vital Signs: BP 122/88   Pulse 100   Temp 98.5 F (36.9 C)   Resp 16   Ht 6' (1.829 m)   Wt (!) 330 lb 9.6 oz (150 kg)   SpO2 96%   BMI 44.84 kg/m    Physical Exam Vitals reviewed.  Constitutional:      Appearance: He is obese.  HENT:     Mouth/Throat:     Mouth: Mucous membranes are moist.     Pharynx: Oropharynx is clear.  Cardiovascular:     Rate and Rhythm: Normal rate and regular rhythm.     Pulses: Normal pulses.     Heart sounds: Normal heart sounds.  Pulmonary:     Effort: Pulmonary effort is normal.     Breath sounds: Normal breath sounds.  Abdominal:     General: Abdomen is flat.     Palpations: Abdomen is soft.  Musculoskeletal:        General: Normal range of  motion.     Cervical back: Normal range of motion.  Skin:    General: Skin is warm.  Neurological:     General: No focal deficit present.     Mental Status: He is alert and oriented to person, place, and time. Mental status is at baseline.  Psychiatric:        Mood and Affect: Mood normal.        Behavior: Behavior normal.        Thought Content: Thought content normal.    Assessment/Plan: 1. Uncontrolled type 2 diabetes mellitus with hyperglycemia (HCC) A1C from 08/09, 11.1. His home blood sugar reveal much improved blood sugars. Samples given for Steglujan as well as Elon JesterBasaglar Kwikpen and prescriptions sent. Will continue with this therapy and continue to monitor A1C levels. Encouraged to continue to routinely monitor levels at home. - Ertugliflozin-SITagliptin (STEGLUJAN) 5-100 MG TABS; Take 1 tab po daily  Dispense: 30 tablet; Refill: 0  2. Screening for diabetic retinopathy Referral for annual eye exam. - Ambulatory referral to Ophthalmology  3. Essential hypertension, benign BP stable at this time, will continue current therapy and continue to monitor.  4. Morbid obesity (HCC) Discussed the importance for him to continue working on weight through healthy eating habits as well as daily exercise. Discussed the negative effects obesity has on his DM, HTN and overall health. Obesity Counseling: Risk Assessment: An assessment of behavioral risk factors was made today and includes lack of exercise sedentary lifestyle, lack of portion control and poor dietary habits.  Risk Modification Advice: She was counseled on portion control guidelines. Restricting daily caloric intake. The detrimental long term effects of obesity on her health and ongoing poor compliance was also discussed with the patient.  Labs ordered and to be reviewed at CPE 9/7. - CBC w/Diff/Platelet - COMPLETE METABOLIC PANEL WITH GFR - Lipid Panel With LDL/HDL Ratio - TSH + free T4  General Counseling: Jermon  verbalizes understanding of the findings of todays visit and agrees with plan of treatment. I have discussed any further diagnostic evaluation that may be  needed or ordered today. We also reviewed his medications today. he has been encouraged to call the office with any questions or concerns that should arise related to todays visit.  Orders Placed This Encounter  Procedures  . CBC w/Diff/Platelet  . COMPLETE METABOLIC PANEL WITH GFR  . Lipid Panel With LDL/HDL Ratio  . TSH + free T4  . Ambulatory referral to Ophthalmology    Meds ordered this encounter  Medications  . Ertugliflozin-SITagliptin (STEGLUJAN) 5-100 MG TABS    Sig: Take 1 tab po daily    Dispense:  30 tablet    Refill:  0  . DISCONTD: Insulin Glargine (BASAGLAR KWIKPEN) 100 UNIT/ML    Sig: Inject 0.22 mLs (22 Units total) into the skin at bedtime.    Dispense:  30 mL    Refill:  1    Time spent: 30 Minutes   This patient was seen by Leeanne Deed AGNP-C in Collaboration with Dr Lyndon Code as a part of collaborative care agreement     Lubertha Basque. Daymien Goth AGNP-C Internal medicine

## 2020-06-02 NOTE — Telephone Encounter (Signed)
Patients call has been returned.  He will call me back later on today to reschedule from 06/04/20 with Dr. Tobi Bastos.  Thanks,  [place], [place]

## 2020-06-02 NOTE — Telephone Encounter (Signed)
Please change to levemir flex pen

## 2020-06-03 ENCOUNTER — Other Ambulatory Visit: Payer: Self-pay

## 2020-06-03 ENCOUNTER — Telehealth: Payer: Self-pay

## 2020-06-03 DIAGNOSIS — Z1211 Encounter for screening for malignant neoplasm of colon: Secondary | ICD-10-CM

## 2020-06-03 MED ORDER — LEVEMIR FLEXTOUCH 100 UNIT/ML ~~LOC~~ SOPN: PEN_INJECTOR | SUBCUTANEOUS | 4 refills | Status: DC

## 2020-06-03 NOTE — Telephone Encounter (Signed)
Canyou check this out?

## 2020-06-03 NOTE — Telephone Encounter (Signed)
lmom to call us back

## 2020-06-04 ENCOUNTER — Encounter: Admission: RE | Payer: Self-pay | Source: Home / Self Care

## 2020-06-04 ENCOUNTER — Ambulatory Visit: Admission: RE | Admit: 2020-06-04 | Payer: Medicaid Other | Source: Home / Self Care | Admitting: Gastroenterology

## 2020-06-04 ENCOUNTER — Telehealth: Payer: Self-pay

## 2020-06-04 SURGERY — COLONOSCOPY WITH PROPOFOL
Anesthesia: General

## 2020-06-04 NOTE — Telephone Encounter (Signed)
LMOM for OV 9/7

## 2020-06-07 ENCOUNTER — Other Ambulatory Visit: Payer: Self-pay | Admitting: Internal Medicine

## 2020-06-09 ENCOUNTER — Encounter: Payer: Medicaid Other | Admitting: Internal Medicine

## 2020-06-19 ENCOUNTER — Other Ambulatory Visit: Payer: Medicaid Other | Attending: Gastroenterology

## 2020-06-22 ENCOUNTER — Ambulatory Visit: Admission: RE | Admit: 2020-06-22 | Payer: Medicaid Other | Source: Home / Self Care | Admitting: Gastroenterology

## 2020-06-22 ENCOUNTER — Other Ambulatory Visit: Payer: Self-pay

## 2020-06-22 ENCOUNTER — Encounter: Admission: RE | Payer: Self-pay | Source: Home / Self Care

## 2020-06-22 DIAGNOSIS — E1165 Type 2 diabetes mellitus with hyperglycemia: Secondary | ICD-10-CM

## 2020-06-22 SURGERY — COLONOSCOPY WITH PROPOFOL
Anesthesia: General

## 2020-06-22 MED ORDER — STEGLUJAN 5-100 MG PO TABS: ORAL_TABLET | ORAL | 0 refills | Status: DC

## 2020-06-24 ENCOUNTER — Encounter: Payer: Medicaid Other | Admitting: Internal Medicine

## 2020-06-24 ENCOUNTER — Other Ambulatory Visit: Payer: Self-pay | Admitting: Internal Medicine

## 2020-06-24 DIAGNOSIS — E1165 Type 2 diabetes mellitus with hyperglycemia: Secondary | ICD-10-CM

## 2020-06-24 NOTE — Telephone Encounter (Signed)
Can you please check  we gave him steglujan 5/100 is not covered

## 2020-07-16 ENCOUNTER — Encounter: Payer: Self-pay | Admitting: Internal Medicine

## 2020-07-16 ENCOUNTER — Other Ambulatory Visit: Payer: Self-pay

## 2020-07-16 ENCOUNTER — Ambulatory Visit: Payer: Medicaid Other | Admitting: Internal Medicine

## 2020-07-16 DIAGNOSIS — E782 Mixed hyperlipidemia: Secondary | ICD-10-CM

## 2020-07-16 DIAGNOSIS — Z0001 Encounter for general adult medical examination with abnormal findings: Secondary | ICD-10-CM

## 2020-07-16 DIAGNOSIS — M25562 Pain in left knee: Secondary | ICD-10-CM

## 2020-07-16 DIAGNOSIS — G8929 Other chronic pain: Secondary | ICD-10-CM

## 2020-07-16 DIAGNOSIS — E1165 Type 2 diabetes mellitus with hyperglycemia: Secondary | ICD-10-CM

## 2020-07-16 DIAGNOSIS — R300 Dysuria: Secondary | ICD-10-CM

## 2020-07-16 DIAGNOSIS — G4733 Obstructive sleep apnea (adult) (pediatric): Secondary | ICD-10-CM | POA: Diagnosis not present

## 2020-07-16 DIAGNOSIS — M25561 Pain in right knee: Secondary | ICD-10-CM

## 2020-07-16 NOTE — Progress Notes (Signed)
[facility] [address] [place], [place] [address]  Internal MEDICINE  Office Visit Note  Patient Name: Micheal Bowman  [id number]  [id number]  Date of Service: 07/16/2020  Chief Complaint  Patient presents with  . Annual Exam    since pt took shot of insulin beard hair has started to fall out, right knee feels like it could be popping out again, somethings when walking will get a sharp pain  . Diabetes  . Hypertension  . policy update form    received  . Quality Metric Gaps    colonoscopy, covid     HPI Pt is here for routine health maintenance examination, he is fairly new pt to [place], some medications were adjusted on previous visit.His diabetic numbers are better per wife, but he will like to get glucometer which is pain free. Pt is non adherent with diabetic diet. Does not belief in vaccine, does not want to use CPAP machine, He will try O2, might need overnight pox he is scheduled for colonoscopy, went for eye exam, pt does have small incision around his umbilicus and it is oozing at times, c/o bilateral knee pain.   Current Medication: Outpatient Encounter Medications as of 07/16/2020  Medication Sig  . albuterol (PROVENTIL) (2.5 MG/3ML) 0.083% nebulizer solution Inhale into the lungs.  Marland Kitchen aspirin EC 81 MG tablet Take 81 mg by mouth.  Marland Kitchen atorvastatin (LIPITOR) 20 MG tablet Take 20 mg by mouth daily.  Marland Kitchen buPROPion (WELLBUTRIN XL) 300 MG 24 hr tablet Take 300 mg by mouth daily.  . Cholecalciferol (VITAMIN D3) 5000 units TABS Take by mouth.  . diclofenac Sodium (VOLTAREN) 1 % GEL Apply topically.  Tery Sanfilippo Sodium (DSS) 100 MG CAPS Take by mouth.  . Empagliflozin-linaGLIPtin (GLYXAMBI) 10-5 MG TABS Take one tab a day for dm  . fluticasone (FLONASE) 50 MCG/ACT nasal spray Place 2 sprays into both nostrils 2 (two) times daily.  Marland Kitchen GAVILAX 17 GM/SCOOP powder MIX 17G WITH JUICE OR WATER AND DRINK IT 2 TIMES A DAY  . glucose blood (ACCU-CHEK AVIVA PLUS) test  strip Use as instructed to check blood sugar twice daily  . hydrOXYzine (ATARAX/VISTARIL) 25 MG tablet Take by mouth.  . insulin detemir (LEVEMIR FLEXTOUCH) 100 UNIT/ML FlexPen Start 22 units and titrate by 2 units to max 36 units  . lisinopril-hydrochlorothiazide (PRINZIDE,ZESTORETIC) 10-12.5 MG tablet Take 1 tablet by mouth daily.  . meloxicam (MOBIC) 15 MG tablet Take 15 mg by mouth daily.  . metFORMIN (GLUCOPHAGE) 1000 MG tablet Take 1 tablet (1,000 mg total) by mouth 2 (two) times daily with a meal.  . metoprolol succinate (TOPROL-XL) 25 MG 24 hr tablet Take 25 mg by mouth daily.  Marland Kitchen omeprazole (PRILOSEC) 40 MG capsule Take 40 mg by mouth daily.  . ondansetron (ZOFRAN ODT) 4 MG disintegrating tablet Take 1 tablet (4 mg total) by mouth every 8 (eight) hours as needed for nausea or vomiting.  . Oxycodone HCl 10 MG TABS Take 1 tablet (10 mg total) by mouth every 6 (six) hours as needed (PAIN). (Patient taking differently: Take 10 mg by mouth every 8 (eight) hours. )  . rOPINIRole (REQUIP) 1 MG tablet Take 1 mg by mouth at bedtime.  . tamsulosin (FLOMAX) 0.4 MG CAPS capsule Take 0.4 mg by mouth daily.  . Tiotropium Bromide-Olodaterol (STIOLTO RESPIMAT) 2.5-2.5 MCG/ACT AERS Inhale into the lungs.  Marland Kitchen QUEtiapine (SEROQUEL) 100 MG tablet Take 100 mg by mouth at bedtime.  (Patient not taking: Reported  on 07/16/2020)  . [DISCONTINUED] glipiZIDE (GLUCOTROL) 5 MG tablet TAKE 2 TABLETS (10 MG TOTAL) BY MOUTH EVERY MORNING AND 1 TABLET (5 MG TOTAL) NIGHTLY. (Patient not taking: Reported on 06/02/2020)   No facility-administered encounter medications on file as of 07/16/2020.    Surgical History: Past Surgical History:  Procedure Laterality Date  . APPENDECTOMY    . BACK SURGERY    . CHOLECYSTECTOMY    . NASAL SINUS SURGERY    . SHOULDER SURGERY Right     Medical History: Past Medical History:  Diagnosis Date  . Arthritis   . Chronic pain   . COPD (chronic obstructive pulmonary disease) (HCC)    . Diabetes mellitus without complication (HCC)   . Diverticulitis   . Fatty liver   . Hypertension     Family History: Family History  Problem Relation Age of Onset  . Diabetes Mother   . Heart disease Father   . Cancer Sister   . Cancer Sister     Review of Systems  Constitutional: Negative for chills, fatigue and unexpected weight change.  HENT: Negative for congestion, postnasal drip, rhinorrhea, sneezing and sore throat.   Eyes: Negative for redness.  Respiratory: Negative for cough, chest tightness and shortness of breath.   Cardiovascular: Negative for chest pain and palpitations.  Gastrointestinal: Negative for abdominal pain, constipation, diarrhea, nausea and vomiting.  Genitourinary: Negative for dysuria and frequency.  Musculoskeletal: Positive for arthralgias and joint swelling. Negative for back pain and neck pain.  Skin: Negative for rash.  Neurological: Negative.  Negative for tremors and numbness.  Hematological: Negative for adenopathy. Does not bruise/bleed easily.  Psychiatric/Behavioral: Negative for behavioral problems (Depression), sleep disturbance and suicidal ideas. The patient is not nervous/anxious.      Vital Signs: BP 118/78   Pulse 79   Temp (!) 97.3 F (36.3 C)   Resp 16   Ht 6' (1.829 m)   Wt (!) 323 lb 9.6 oz (146.8 kg)   SpO2 98%   BMI 43.89 kg/m    Physical Exam Constitutional:      General: He is not in acute distress.    Appearance: He is well-developed. He is not diaphoretic.  HENT:     Head: Normocephalic and atraumatic.     Mouth/Throat:     Pharynx: No oropharyngeal exudate.  Eyes:     Pupils: Pupils are equal, round, and reactive to light.  Neck:     Thyroid: No thyromegaly.     Vascular: No JVD.     Trachea: No tracheal deviation.  Cardiovascular:     Rate and Rhythm: Normal rate and regular rhythm.     Pulses:          Dorsalis pedis pulses are 2+ on the right side and 2+ on the left side.       Posterior  tibial pulses are 1+ on the right side and 1+ on the left side.     Heart sounds: Normal heart sounds. No murmur heard.  No friction rub. No gallop.   Pulmonary:     Effort: Pulmonary effort is normal. No respiratory distress.     Breath sounds: No wheezing or rales.  Chest:     Chest wall: No tenderness.  Abdominal:     General: Bowel sounds are normal.     Palpations: Abdomen is soft.  Musculoskeletal:        General: Swelling and tenderness present.     Cervical back: Normal range of motion and  neck supple.     Right foot: Decreased range of motion.     Left foot: Decreased range of motion.  Feet:     Right foot:     Protective Sensation: 2 sites tested.     Skin integrity: Erythema, callus and dry skin present.     Left foot:     Protective Sensation: 2 sites tested.     Skin integrity: Erythema, callus and dry skin present.     Comments: Diabetic foot exam   Lymphadenopathy:     Cervical: No cervical adenopathy.  Skin:    General: Skin is warm and dry.     Capillary Refill: Capillary refill takes less than 2 seconds.  Neurological:     General: No focal deficit present.     Mental Status: He is alert and oriented to person, place, and time.     Cranial Nerves: No cranial nerve deficit.  Psychiatric:        Behavior: Behavior normal.        Thought Content: Thought content normal.        Judgment: Judgment normal.      LABS: Recent Results (from the past 2160 hour(s))  POCT HgB A1C     Status: Abnormal   Collection Time: 05/11/20 10:16 AM  Result Value Ref Range   Hemoglobin A1C 11.1 (A) 4.0 - 5.6 %   HbA1c POC (<> result, manual entry)     HbA1c, POC (prediabetic range)     HbA1c, POC (controlled diabetic range)      Assessment/Plan: 1. Encounter for routine adult health examination with abnormal findings All PHM is updated according to guidelines, pt is however non adherent to multiple treatment modalities, labs ordered on RX pad   2. Uncontrolled type 2  diabetes mellitus with hyperglycemia (HCC) Increase Levemir to 28 units and continue all other meds before   3. Obstructive sleep apnea hypopnea, severe Will order overnight pox since pt does not want to use CPAP  4. Morbid obesity (HCC) Obesity Counseling: Risk Assessment: An assessment of behavioral risk factors was made today and includes lack of exercise sedentary lifestyle, lack of portion control and poor dietary habits.  Risk Modification Advice: She was counseled on portion control guidelines. Restricting daily caloric intake to.2100 ADA  The detrimental long term effects of obesity on her health and ongoing poor compliance was also discussed with the patient.  5. Chronic pain of both knees Wt reduction is encouraged and PT   6. Mixed hyperlipidemia Continue Lipitor   7. Dysuria - UA/M w/rflx Culture, Routine  General Counseling: Jaquel verbalizes understanding of the findings of todays visit and agrees with plan of treatment. I have discussed any further diagnostic evaluation that may be needed or ordered today. We also reviewed his medications today. he has been encouraged to call the office with any questions or concerns that should arise related to todays visit.  Counseling: Diabetes Counseling:  1. Addition of ACE inh/ ARB'S for nephroprotection. Microalbumin is updated  2. Diabetic foot care, prevention of complications. Podiatry consult 3. Exercise and lose weight.  4. Diabetic eye examination, Diabetic eye exam is updated  5. Monitor blood sugar closlely. nutrition counseling.  6. Sign and symptoms of hypoglycemia including shaking sweating,confusion and headaches.  Orders Placed This Encounter  Procedures  . UA/M w/rflx Culture, Routine    Total time spent: 35 Minutes  Time spent includes review of chart, medications, test results, and follow up plan with the patient.  Lavera Guise, MD  Internal Medicine

## 2020-07-17 LAB — UA/M W/RFLX CULTURE, ROUTINE
Bilirubin, UA: NEGATIVE
Leukocytes,UA: NEGATIVE
Nitrite, UA: NEGATIVE
Protein,UA: NEGATIVE
RBC, UA: NEGATIVE
Specific Gravity, UA: >=1.03 — AB (ref 1.005–1.030)
Urobilinogen, Ur: 0.2 mg/dL (ref 0.2–1.0)
pH, UA: 7.5 (ref 5.0–7.5)

## 2020-07-17 LAB — MICROSCOPIC EXAMINATION
Bacteria, UA: NONE SEEN
Casts: NONE SEEN /lpf
Epithelial Cells (non renal): NONE SEEN /hpf (ref 0–10)
RBC, Urine: NONE SEEN /hpf (ref 0–2)
WBC, UA: NONE SEEN /hpf (ref 0–5)

## 2020-07-20 ENCOUNTER — Other Ambulatory Visit
Admission: RE | Admit: 2020-07-20 | Discharge: 2020-07-20 | Disposition: A | Discharge status: Home / Self Care | Payer: Medicaid Other | Attending: Internal Medicine | Admitting: Internal Medicine

## 2020-07-20 DIAGNOSIS — E1165 Type 2 diabetes mellitus with hyperglycemia: Secondary | ICD-10-CM | POA: Insufficient documentation

## 2020-07-20 DIAGNOSIS — E785 Hyperlipidemia, unspecified: Secondary | ICD-10-CM | POA: Insufficient documentation

## 2020-07-20 DIAGNOSIS — I10 Essential (primary) hypertension: Secondary | ICD-10-CM | POA: Diagnosis not present

## 2020-07-20 LAB — COMPREHENSIVE METABOLIC PANEL
ALT: 17 U/L (ref 0–44)
AST: 26 U/L (ref 15–41)
Albumin: 4.1 g/dL (ref 3.5–5.0)
Alkaline Phosphatase: 66 U/L (ref 38–126)
Anion gap: 13 (ref 5–15)
BUN: 17 mg/dL (ref 6–20)
CO2: 25 mmol/L (ref 22–32)
Calcium: 9.7 mg/dL (ref 8.9–10.3)
Chloride: 94 mmol/L — ABNORMAL LOW (ref 98–111)
Creatinine, Ser: 0.98 mg/dL (ref 0.61–1.24)
GFR, Estimated: >60 mL/min (ref >60)
Glucose, Bld: 180 mg/dL — ABNORMAL HIGH (ref 70–99)
Potassium: 4.6 mmol/L (ref 3.5–5.1)
Sodium: 132 mmol/L — ABNORMAL LOW (ref 135–145)
Total Bilirubin: 0.6 mg/dL (ref 0.3–1.2)
Total Protein: 8 g/dL (ref 6.5–8.1)

## 2020-07-20 LAB — LIPID PANEL
Cholesterol: 126 mg/dL (ref 0–200)
HDL: 46 mg/dL (ref >40)
LDL Cholesterol: 42 mg/dL (ref 0–99)
Total CHOL/HDL Ratio: 2.7 RATIO
Triglycerides: 190 mg/dL — ABNORMAL HIGH (ref <150)
VLDL: 38 mg/dL (ref 0–40)

## 2020-07-20 LAB — TSH: TSH: 4.123 u[IU]/mL (ref 0.350–4.500)

## 2020-07-20 LAB — T4, FREE: Free T4: 0.78 ng/dL (ref 0.61–1.12)

## 2020-07-20 LAB — CBC
HCT: 50.4 % (ref 39.0–52.0)
Hemoglobin: 16 g/dL (ref 13.0–17.0)
MCH: 27.5 pg (ref 26.0–34.0)
MCHC: 31.7 g/dL (ref 30.0–36.0)
MCV: 86.7 fL (ref 80.0–100.0)
Platelets: 379 10*3/uL (ref 150–400)
RBC: 5.81 MIL/uL (ref 4.22–5.81)
RDW: 15.7 % — ABNORMAL HIGH (ref 11.5–15.5)
WBC: 11.5 10*3/uL — ABNORMAL HIGH (ref 4.0–10.5)
nRBC: 0 % (ref 0.0–0.2)

## 2020-07-20 LAB — PSA: Prostatic Specific Antigen: 0.55 ng/mL (ref 0.00–4.00)

## 2020-07-20 NOTE — Progress Notes (Signed)
Labs reviwed, will be discussed on next follow up

## 2020-07-29 ENCOUNTER — Other Ambulatory Visit: Payer: Self-pay

## 2020-07-29 MED ORDER — METOPROLOL SUCCINATE ER 50 MG PO TB24
50.0000 mg | ORAL_TABLET | Freq: Every day | ORAL | 2 refills | Status: DC
Start: 2020-07-29 — End: 2023-03-28

## 2020-08-19 ENCOUNTER — Other Ambulatory Visit: Payer: Self-pay | Admitting: Internal Medicine

## 2020-09-01 ENCOUNTER — Ambulatory Visit: Payer: Medicaid Other | Admitting: Internal Medicine

## 2020-09-01 ENCOUNTER — Ambulatory Visit: Payer: Medicaid Other | Admitting: Hospice and Palliative Medicine

## 2020-09-11 ENCOUNTER — Other Ambulatory Visit: Payer: Self-pay | Admitting: Internal Medicine

## 2020-09-11 DIAGNOSIS — E1165 Type 2 diabetes mellitus with hyperglycemia: Secondary | ICD-10-CM

## 2020-09-28 ENCOUNTER — Other Ambulatory Visit: Payer: Self-pay | Admitting: Internal Medicine

## 2020-10-17 ENCOUNTER — Other Ambulatory Visit: Payer: Self-pay | Admitting: Internal Medicine

## 2020-10-17 DIAGNOSIS — E1165 Type 2 diabetes mellitus with hyperglycemia: Secondary | ICD-10-CM

## 2020-10-19 NOTE — Telephone Encounter (Signed)
Pt need follow appt for refills

## 2020-10-23 ENCOUNTER — Other Ambulatory Visit: Payer: Self-pay | Admitting: Internal Medicine

## 2020-10-23 NOTE — Telephone Encounter (Signed)
Pt needs app

## 2020-11-18 ENCOUNTER — Other Ambulatory Visit: Payer: Self-pay | Admitting: Internal Medicine

## 2020-11-19 ENCOUNTER — Other Ambulatory Visit: Payer: Self-pay | Admitting: Internal Medicine

## 2020-11-19 ENCOUNTER — Telehealth: Payer: Self-pay

## 2020-11-19 NOTE — Telephone Encounter (Signed)
This pt needs follow up, refill for one month

## 2020-11-19 NOTE — Telephone Encounter (Signed)
lmom pt need appt for refills  

## 2020-11-20 NOTE — Telephone Encounter (Signed)
Pt need appt for refills  ?

## 2020-11-26 NOTE — Telephone Encounter (Signed)
Pt need appt

## 2020-11-27 ENCOUNTER — Telehealth: Payer: Self-pay

## 2020-11-27 NOTE — Telephone Encounter (Signed)
LMOM to schedule appt.

## 2020-12-11 ENCOUNTER — Other Ambulatory Visit: Payer: Self-pay | Admitting: Internal Medicine

## 2020-12-14 ENCOUNTER — Telehealth: Payer: Self-pay

## 2020-12-14 NOTE — Telephone Encounter (Signed)
LMOM for pt to make appt

## 2021-01-13 ENCOUNTER — Telehealth: Payer: Self-pay

## 2021-01-13 MED ORDER — QUETIAPINE FUMARATE 100 MG PO TABS: ORAL_TABLET | ORAL | 4 refills | Status: AC

## 2021-01-13 NOTE — Telephone Encounter (Signed)
PA approved for QUETIAPINE FUMARATE 90 tablets per 90 days, starts 12/22/20 to 01/22/21

## 2021-02-25 ENCOUNTER — Other Ambulatory Visit: Payer: Self-pay | Admitting: Internal Medicine

## 2021-07-20 ENCOUNTER — Encounter: Payer: Medicaid Other | Admitting: Nurse Practitioner

## 2021-09-23 ENCOUNTER — Encounter: Payer: Self-pay | Admitting: Internal Medicine

## 2021-12-09 ENCOUNTER — Other Ambulatory Visit: Payer: Self-pay | Admitting: Internal Medicine

## 2022-01-03 ENCOUNTER — Other Ambulatory Visit: Payer: Self-pay | Admitting: Internal Medicine

## 2022-01-03 ENCOUNTER — Telehealth: Payer: Self-pay

## 2022-01-03 NOTE — Telephone Encounter (Signed)
Spoke with pt we are no longer his pcp and pt said he already know about cologuard result is positive and also his PCP  is aware about  cologuard  result  ?

## 2022-01-03 NOTE — Telephone Encounter (Signed)
-----   Message from Lyndon Code, MD sent at 01/03/2022  9:40 AM EDT ----- ?His Cologaurd is abnormal, has not been seen over a year, is he our pt, we need to mail him his cologaurd results. Please print and I will write on it before we mail it  ? ?

## 2022-01-19 ENCOUNTER — Other Ambulatory Visit: Payer: Self-pay | Admitting: Internal Medicine

## 2023-02-03 DIAGNOSIS — J449 Chronic obstructive pulmonary disease, unspecified: Secondary | ICD-10-CM | POA: Insufficient documentation

## 2023-02-03 DIAGNOSIS — K760 Fatty (change of) liver, not elsewhere classified: Secondary | ICD-10-CM | POA: Insufficient documentation

## 2023-02-03 DIAGNOSIS — K5792 Diverticulitis of intestine, part unspecified, without perforation or abscess without bleeding: Secondary | ICD-10-CM | POA: Insufficient documentation

## 2023-02-03 DIAGNOSIS — M1990 Unspecified osteoarthritis, unspecified site: Secondary | ICD-10-CM | POA: Insufficient documentation

## 2023-02-03 DIAGNOSIS — I10 Essential (primary) hypertension: Secondary | ICD-10-CM | POA: Insufficient documentation

## 2023-02-03 DIAGNOSIS — E119 Type 2 diabetes mellitus without complications: Secondary | ICD-10-CM | POA: Insufficient documentation

## 2023-02-21 ENCOUNTER — Other Ambulatory Visit: Payer: Self-pay

## 2023-02-27 NOTE — Progress Notes (Unsigned)
Cardiology Office Note:    Date:  02/28/2023   ID:  Micheal Santiago., DOB 1966-01-08, MRN [id number]  PCP:  Ailene Ravel, MD  Cardiologist:  Norman Herrlich, MD   Referring MD: Ailene Ravel, MD  ASSESSMENT:    1. Chest pain of uncertain etiology   2. Palpitations   3. Hypertensive heart disease without heart failure   4. Type 2 diabetes mellitus with diabetic mononeuropathy, without long-term current use of insulin (HCC)   5. Hyperlipidemia, unspecified hyperlipidemia type   6. Chronic obstructive pulmonary disease, unspecified COPD type (HCC)    PLAN:    In order of problems listed above:  Is a very complex case with a triad of clinical symptoms including rapid heart rate severe limiting shortness of breath and chest discomfort possible angina.  He takes Zanaflex which can cause reflex tachycardia I will have him stop he will continue his beta-blocker. For further evaluation event monitor from 1 week to assess for arrhythmia and an echocardiogram for structural heart disease At some point he will need an ischemia evaluation and I think in view of his body habitus and resting tachycardia he is poorly suited for either perfusion study or cardiac CTA. If the studies are unrevealing then I think he would likely need to undergo diagnostic heart catheterization. Continue his current antihypertensive including lisinopril thiazide diuretic and his beta-blocker Continue statin his lipids are at target Poorly controlled diabetes A1c exceeds 10 I am concerned about the severity of his COPD and the potential for exercise-induced hypoxemia he declined having further evaluation like PFTs at this time  Next appointment 4 weeks   Medication Adjustments/Labs and Tests Ordered: Current medicines are reviewed at length with the patient today.  Concerns regarding medicines are outlined above.  No orders of the defined types were placed in this encounter.  No orders of the defined types  were placed in this encounter.       History of Present Illness:    Micheal Pennino [place]. is a 57 y.o. male who is being seen today for the evaluation of chest pain at the request of Hamrick, Durward Fortes, MD.  He was seen [facility] ED 09/19/2022 with chest pain.  His history is noteworthy for hypertension type 2 diabetes and asthma substance abuse and CAD.  His troponin was normal EKG was described as showing no ischemic changes and he was discharged from the emergency room.  CT of the chest at that time pulmonary embolism protocol showed no coronary or aortic atherosclerosis no aortopathy or finding of pulmonary embolism.  He was previously seen by my partner Dr. Kirke Corin in December 2018 for palpitation and chest pain he did not have a repeat ischemia evaluation.  He had a myocardial perfusion study at that time showing normal perfusion EF 81% normal left ventricular function low risk myocardial perfusion study.  Chart review shows he had an echocardiogram performed October 2018 at [facility] conclusions left ventricular hypertrophy impaired relaxation otherwise normal.  Left heart catheterization performed 01/22/2014 at [facility].  Summary normal left heart catheterization no evidence of significant CAD.  Prior to the left heart catheterization he had a myocardial perfusion study performed showing possible ischemia in the apical inferior and mid inferolateral segments.  He was seen with his primary care physician 12/08/2020 for his type 2 diabetes hypertension hyperlipidemia taking a high intensity statin as well as COPD.  He was subsequently seen 01/27/2023 with complaints of palpitation heart rate in 120s  associated with shortness of breath chest pain diaphoresis and nausea and vomiting.  He was noted his diabetes was poorly controlled with an A1c of 10.3 his EKG independently reviewed by me showed sinus tachycardia rate approximately 100 bpm poor R wave progression nonspecific T wave  flattening.  His mother is present participates in evaluation decision For about a year he is not feeling well he has marked exercise intolerance and he has 3 cardiac complaints First visit his heart is rapid at x 120 beats or greater at rest he takes Zanaflex that can cause reflex tachycardia He is very short of breath with any activities and he has trouble he tries to go outside and walk and he is to stop because of shortness of breath.  He has COPD and uses bronchodilators he is not coughing or wheezing he tells me at times he gets oxygen saturations in the 80s. With activity he gets discomfort in his chest and lasted x 30 minutes or greater.  On 2 occasions he has taken nitroglycerin which has given him partial relief His last all day and also occurs at rest at times. Is not pleuritic in nature. His symptoms have slowly progressed over the last year and he is frustrated.  He relates the onset of his symptoms when he took Trulicity. He did does not have edema orthopnea but sleeps with the head of the bed elevated due to reflux His mother tells me as a child he had a VSD followed at [facility] that closed at age 57  Recent labs 0 12/07/2022 cholesterol 125 LDL 50 A1c 10.3 hemoglobin 17.5 creatinine 0.83 potassium 5.0  Past Medical History:  Diagnosis Date   Abnormal serum immunoelectrophoresis 06/12/2017   Formatting of this note might be different from the original.  Eval with HemeOnc 12/2017, supposed to f/u in 3 months.  MGUS   Arthritis    Chewing tobacco use 05/18/2017   Chronic pain    Chronic pain of right knee 11/16/2017   Formatting of this note might be different from the original.  Patient reports prior arthroscopy in ~2015 in [place] -- has had injections in past as well.     [facility] Radiology 05/2017 (under media tab):   Findings: There is no acute fracture or malalignment. No bone destruction or erosion. The visualized joint spaces are normal and there is no joint effusion.  The periarticular soft tissues app   Chronic shortness of breath 09/18/2017   Chronic, continuous use of opioids 06/01/2017   Formatting of this note might be different from the original.  Refilled Oxycodone 10mg  #90 on 06/01/2017  Appt with Pain Mangemement   COPD (chronic obstructive pulmonary disease) (HCC)    Diabetes mellitus without complication (HCC)    Diverticulitis    Drug-induced constipation 03/05/2014   Elevated C-reactive protein 06/12/2017   Elevated erythrocyte sedimentation rate 06/12/2017   Esophageal reflux 02/14/2013   Essential tremor 07/06/2016   Family history of ischemic heart disease (IHD) 05/02/2013   Fatty liver    Hypertension    Hypertensive heart disease without heart failure 05/02/2013   Formatting of this note might be different from the original.  Diastolic dysfunction on TTE 07/2017   Mild intermittent asthma without complication 02/12/2020   Mood disorder due to a general medical condition 05/18/2017   Non-cardiac chest pain 01/20/2014   Formatting of this note might be different from the original.  Visit to [facility] ED 03/2017 with atypical chest pain. Negative EKG, negative enzymes  x3.  Underwent stress test without ischemia, normal EF, normal CXR, negative CT angio for dissection or PE.  Risk factors: tobacco use, hypertension, untreated OSA     NM SPECT (03/24/2017)  Pharmacological myocardial perfusion imaging study with no signifi   Obesity hypoventilation syndrome (HCC) 05/27/2020   Formatting of this note might be different from the original.  per last BMI in vitals on 09/18/2017   Pain medication agreement signed 12/19/2017   Formatting of this note might be different from the original.  [facility] AGREEMENT; Read, reviewed and signed. Copy given to patient. Original to Medical Records. LJ/CMA  05/18/2020     Goal: "play with grandson more".   Panic attacks 02/21/2018   Positive FIT (fecal immunochemical test) 07/31/2017    Formatting of this note might be different from the original.  Referred for colo 07/2017, 11/2017, 01/2018 -- patient has not scheduled   Primary osteoarthritis of right knee 02/14/2013   Risk for falls 08/22/2018   Type 2 diabetes mellitus with diabetic mononeuropathy, without long-term current use of insulin (HCC) 05/19/2017   Formatting of this note is different from the original.     Date A1c   01/21/2014 5.4   05/18/2017 7.2   07/20/2017 8.4   09/11/2017 9.7 ([place])   10/30/2017 8.0    Past Surgical History:  Procedure Laterality Date   APPENDECTOMY     BACK SURGERY     CHOLECYSTECTOMY     NASAL SINUS SURGERY     SHOULDER SURGERY Right     Current Medications: Current Meds  Medication Sig   ACCU-CHEK AVIVA PLUS test strip USE AS INSTRUCTED TO CHECK BLOOD SUGAR TWICE DAILY   albuterol (PROVENTIL) (2.5 MG/3ML) 0.083% nebulizer solution Inhale 2.5 mg into the lungs as needed for wheezing or shortness of breath.   aspirin EC 81 MG tablet Take 81 mg by mouth daily.   atorvastatin (LIPITOR) 20 MG tablet Take 20 mg by mouth daily.   buPROPion (WELLBUTRIN XL) 300 MG 24 hr tablet Take 300 mg by mouth daily.   Cholecalciferol (VITAMIN D3) 5000 units TABS Take 5,000 Units by mouth daily.   Docusate Sodium (DSS) 100 MG CAPS Take 100 mg by mouth as needed (constipation).   FARXIGA 10 MG TABS tablet Take 10 mg by mouth daily.   fluticasone (FLONASE) 50 MCG/ACT nasal spray Place 2 sprays into both nostrils 2 (two) times daily.   GAVILAX 17 GM/SCOOP powder MIX 17G WITH JUICE OR WATER AND DRINK 2 TIMES A DAY   glimepiride (AMARYL) 4 MG tablet Take 4 mg by mouth in the morning and at bedtime.   GLYXAMBI 10-5 MG TABS TAKE 1 TABLET BY MOUTH DAILY FOR DIABETES   hydrOXYzine (ATARAX/VISTARIL) 25 MG tablet Take 25 mg by mouth as needed for anxiety.   LEVEMIR FLEXTOUCH 100 UNIT/ML FlexPen START 22 UNITS AND TITRATE BY 2 UNITS TO MAX 36 UNITS   lisinopril-hydrochlorothiazide (PRINZIDE,ZESTORETIC)  10-12.5 MG tablet Take 1 tablet by mouth daily.   meloxicam (MOBIC) 15 MG tablet Take 15 mg by mouth daily.   metFORMIN (GLUCOPHAGE) 1000 MG tablet Take 1 tablet (1,000 mg total) by mouth 2 (two) times daily with a meal.   metoprolol succinate (TOPROL-XL) 50 MG 24 hr tablet Take 1 tablet (50 mg total) by mouth daily.   omeprazole (PRILOSEC) 40 MG capsule Take 40 mg by mouth daily.   ondansetron (ZOFRAN ODT) 4 MG disintegrating tablet Take 1 tablet (4 mg total) by mouth  every 8 (eight) hours as needed for nausea or vomiting.   Oxycodone HCl 10 MG TABS Take 1 tablet (10 mg total) by mouth every 6 (six) hours as needed (PAIN).   QUEtiapine (SEROQUEL) 100 MG tablet TAKE 1 TABLET BY MOUTH EVERY DAY AT NIGHT   rOPINIRole (REQUIP) 1 MG tablet TAKE 1 TABLET BY MOUTH EVERY DAY IN THE EVENING   tamsulosin (FLOMAX) 0.4 MG CAPS capsule Take 0.4 mg by mouth daily.   Tiotropium Bromide-Olodaterol (STIOLTO RESPIMAT) 2.5-2.5 MCG/ACT AERS Inhale 1 each into the lungs daily.   tiZANidine (ZANAFLEX) 4 MG tablet Take 8 mg by mouth at bedtime as needed for muscle spasms.     Allergies:   Fluticasone-salmeterol, Gabapentin, Hydromorphone, Pregabalin, Tylenol [acetaminophen], Celecoxib, Codeine, Dulaglutide, and Ibuprofen   Social History   Socioeconomic History   Marital status: Single    Spouse name: Not on file   Number of children: Not on file   Years of education: Not on file   Highest education level: Not on file  Occupational History   Not on file  Tobacco Use   Smoking status: Former    Types: Cigars    Quit date: 07/06/2014    Years since quitting: 8.6   Smokeless tobacco: Current    Types: Snuff  Substance and Sexual Activity   Alcohol use: No    Comment: beer every 3 months   Drug use: No   Sexual activity: Not on file  Other Topics Concern   Not on file  Social History Narrative   He working on his disability.  He previously did Holiday representative work, last in 2010.   He lives at home with  fiance in a [place].   Highest level of education: 9th grade   Social Determinants of Health   Financial Resource Strain: Not on file  Food Insecurity: Not on file  Transportation Needs: Not on file  Physical Activity: Not on file  Stress: Not on file  Social Connections: Not on file     Family History: The patient's family history includes Cancer in his sister and sister; Diabetes in his mother; Heart disease in his father.  ROS:   ROS Please see the history of present illness.     All other systems reviewed and are negative.  EKGs/Labs/Other Studies Reviewed:    The following studies were reviewed today:   Cardiac Studies & Procedures     STRESS TESTS  NM MYOCAR MULTI W/SPECT W 03/24/2017  Narrative Pharmacological myocardial perfusion imaging study with no significant  ischemia Normal wall motion, EF estimated at 81% No EKG changes concerning for ischemia at peak stress or in recovery. Low risk scan   Signed, Dossie Arbour, MD, Ph.D [facility]   ECHOCARDIOGRAM  ECHOCARDIOGRAM COMPLETE 09/11/2017  Narrative *[facility]* [address] [place], [place] [address] [phone]  ------------------------------------------------------------------- Transthoracic Echocardiography  Patient:    Micheal Bowman, Micheal Bowman MR #:       [id number] Study Date: 09/11/2017 Gender:     M Age:        51 Height:     182.9 cm Weight:     154.2 kg BSA:        2.87 m^2 Pt. Status: Room:       255A  ADMITTING    Katha Hamming Md SONOGRAPHER  [facility] ORDERING     [place], [place], [place] ATTENDING    Pershing Proud Myra Rude PERFORMING   [facility]  cc:  -------------------------------------------------------------------  LV EF: 55% -   60%  ------------------------------------------------------------------- Indications:      Chest pain  786.50.  ------------------------------------------------------------------- History:   PMH:  Chronic pain.  Chronic obstructive pulmonary disease.  Risk factors:  Hypertension.  ------------------------------------------------------------------- Study Conclusions  - Procedure narrative: The only obtainable view was parasternal. Transthoracic echocardiography. The study was technically difficult. - Left ventricle: The cavity size was normal. There was mild concentric hypertrophy. Systolic function was normal. The estimated ejection fraction was in the range of 55% to 60%. Wall motion was normal; there were no regional wall motion abnormalities. The study is not technically sufficient to allow evaluation of LV diastolic function.  ------------------------------------------------------------------- Study data:   Study status:  Routine.  Procedure:  The only obtainable view was parasternal. Transthoracic echocardiography. The study was technically difficult.          Transthoracic echocardiography.  M-mode, complete 2D, spectral Doppler, and color Doppler.  Birthdate:  Patient birthdate: [birth year].  Age:  Patient is 57 yr old.  Sex:  Gender: male.    BMI: 46.1 kg/m^2.  Patient status:  Inpatient.  Study date:  Study date: 09/11/2017. Study time: 10:59 AM.  Location:  Echo laboratory.  -------------------------------------------------------------------  ------------------------------------------------------------------- Left ventricle:  The cavity size was normal. There was mild concentric hypertrophy. Systolic function was normal. The estimated ejection fraction was in the range of 55% to 60%. Wall motion was normal; there were no regional wall motion abnormalities. The study is not technically sufficient to allow evaluation of LV diastolic function.  ------------------------------------------------------------------- Aortic valve:  Poorly visualized.  Trileaflet; normal  thickness leaflets. Mobility was not restricted.  Doppler:  Transvalvular velocity was within the normal range. There was no stenosis. There was no regurgitation.  ------------------------------------------------------------------- Aorta:  The aorta was poorly visualized. Aortic root: The aortic root was normal in size.  ------------------------------------------------------------------- Mitral valve:  Poorly visualized.  Structurally normal valve. Mobility was not restricted.  Doppler:  Transvalvular velocity was within the normal range. There was no evidence for stenosis. There was no regurgitation.  ------------------------------------------------------------------- Left atrium:  The atrium was normal in size.  ------------------------------------------------------------------- Right ventricle:  The cavity size was normal. Wall thickness was normal. Systolic function was normal.  ------------------------------------------------------------------- Pulmonic valve:    Doppler:  Transvalvular velocity was within the normal range. There was no evidence for stenosis.  ------------------------------------------------------------------- Tricuspid valve:   Structurally normal valve.    Doppler: Transvalvular velocity was within the normal range. There was no regurgitation.  ------------------------------------------------------------------- Pulmonary artery:   The main pulmonary artery was normal-sized. Systolic pressure could not be accurately estimated.  ------------------------------------------------------------------- Right atrium:  The atrium was normal in size.  ------------------------------------------------------------------- Pericardium:  There was no pericardial effusion.  ------------------------------------------------------------------- Systemic veins: Inferior vena cava: Not visualized.  ------------------------------------------------------------------- Post  procedure conclusions Ascending Aorta:  - The aorta was poorly visualized.  ------------------------------------------------------------------- Measurements  Left ventricle                          Value        Reference LV ID, ED, PLAX chordal         (L)     37    mm     43 - 52 LV ID, ES, PLAX chordal                 25.3  mm     23 - 38 LV fx shortening, PLAX chordal  32    %      >=29 LV PW thickness, ED                     12.3  mm     --------- IVS/LV PW ratio, ED                     0.86         <=1.3  Ventricular septum                      Value        Reference IVS thickness, ED                       10.6  mm     ---------  LVOT                                    Value        Reference LVOT ID, S                              20    mm     --------- LVOT area                               3.14  cm^2   ---------  Aorta                                   Value        Reference Aortic root ID, ED                      29    mm     ---------  Left atrium                             Value        Reference LA ID, A-P, ES                          31    mm     --------- LA ID/bsa, A-P                          1.08  cm/m^2 <=2.2  Right ventricle                         Value        Reference RV ID, ED, PLAX                         26.4  mm     19 - 38  Pulmonic valve                          Value        Reference Pulmonic valve peak velocity, S         80    cm/s   ---------  Legend: (L)  and  (H)  mark values outside specified reference range.  ------------------------------------------------------------------- Prepared and Electronically Authenticated by  Lorine Bears, MD 2018-12-10T13:32:41             EKG:  EKG is  ordered today.  The ekg ordered today is personally reviewed and demonstrates sinus tachycardia 106 bpm normal R wave progression and T wave abnormality.  Months   Recent Lipid Panel    Component Value Date/Time   CHOL 126 07/20/2020 0750    TRIG 190 (H) 07/20/2020 0750   HDL 46 07/20/2020 0750   CHOLHDL 2.7 07/20/2020 0750   VLDL 38 07/20/2020 0750   LDLCALC 42 07/20/2020 0750    Physical Exam:    VS:  BP 128/80   Pulse (!) 106   Ht 6' (1.829 m)   Wt (!) 302 lb 6.4 oz (137.2 kg)   SpO2 94%   BMI 41.01 kg/m     Wt Readings from Last 3 Encounters:  02/28/23 (!) 302 lb 6.4 oz (137.2 kg)  07/16/20 (!) 323 lb 9.6 oz (146.8 kg)  06/02/20 (!) 330 lb 9.6 oz (150 kg)     GEN: Quite obese BMI exceeds 40 well nourished, well developed in no acute distress HEENT: Normal NECK: No JVD; No carotid bruits LYMPHATICS: No lymphadenopathy CARDIAC: RRR, no murmurs, rubs, gallops RESPIRATORY:  Clear to auscultation without rales, wheezing or rhonchi  ABDOMEN: Soft, non-tender, non-distended MUSCULOSKELETAL:  No edema; No deformity  SKIN: Warm and dry NEUROLOGIC:  Alert and oriented x 3 PSYCHIATRIC:  Normal affect     Signed, Norman Herrlich, MD  02/28/2023 9:28 AM    [facility]

## 2023-02-28 ENCOUNTER — Ambulatory Visit: Payer: Medicaid Other | Attending: Cardiology

## 2023-02-28 ENCOUNTER — Telehealth: Payer: Self-pay

## 2023-02-28 ENCOUNTER — Ambulatory Visit: Payer: Medicaid Other | Attending: Cardiology | Admitting: Cardiology

## 2023-02-28 ENCOUNTER — Encounter: Payer: Self-pay | Admitting: Cardiology

## 2023-02-28 VITALS — BP 128/80 | HR 106 | Ht 72.0 in | Wt 302.4 lb

## 2023-02-28 DIAGNOSIS — Z7984 Long term (current) use of oral hypoglycemic drugs: Secondary | ICD-10-CM

## 2023-02-28 DIAGNOSIS — I119 Hypertensive heart disease without heart failure: Secondary | ICD-10-CM

## 2023-02-28 DIAGNOSIS — E1141 Type 2 diabetes mellitus with diabetic mononeuropathy: Secondary | ICD-10-CM

## 2023-02-28 DIAGNOSIS — R002 Palpitations: Secondary | ICD-10-CM | POA: Diagnosis not present

## 2023-02-28 DIAGNOSIS — E785 Hyperlipidemia, unspecified: Secondary | ICD-10-CM

## 2023-02-28 DIAGNOSIS — R079 Chest pain, unspecified: Secondary | ICD-10-CM

## 2023-02-28 DIAGNOSIS — J449 Chronic obstructive pulmonary disease, unspecified: Secondary | ICD-10-CM

## 2023-02-28 LAB — ECHOCARDIOGRAM COMPLETE
Area-P 1/2: 4.25 cm2
Height: 72 in
S' Lateral: 3.5 cm
Weight: 4838.4 oz

## 2023-02-28 NOTE — Telephone Encounter (Signed)
-----   Message from Baldo Daub, MD sent at 02/28/2023  1:33 PM EDT ----- Echo is good does not show findings of heart failure.

## 2023-02-28 NOTE — Telephone Encounter (Signed)
Patient notified through my chart.

## 2023-02-28 NOTE — Patient Instructions (Signed)
Medication Instructions:  Your physician has recommended you make the following change in your medication:   STOP: Zanaflex  *If you need a refill on your cardiac medications before your next appointment, please call your pharmacy*   Lab Work: None If you have labs (blood work) drawn today and your tests are completely normal, you will receive your results only by: MyChart Message (if you have MyChart) OR A paper copy in the mail If you have any lab test that is abnormal or we need to change your treatment, we will call you to review the results.   Testing/Procedures: A zio monitor was ordered today. It will remain on for 7 days. You will then return monitor and event diary in provided box. It takes 1-2 weeks for report to be downloaded and returned to [place]. We will call you with the results. If monitor falls off or has orange flashing light, please call [employer] for further instructions.   Your physician has requested that you have an echocardiogram. Echocardiography is a painless test that uses sound waves to create images of your heart. It provides your doctor with information about the size and shape of your heart and how well your heart's chambers and valves are working. This procedure takes approximately one hour. There are no restrictions for this procedure. Please do NOT wear cologne, perfume, aftershave, or lotions (deodorant is allowed). Please arrive 15 minutes prior to your appointment time.    Follow-Up: At [facility], you and your health needs are our priority.  As part of our continuing mission to provide you with exceptional heart care, we have created designated Provider Care Teams.  These Care Teams include your primary Cardiologist (physician) and Advanced Practice Providers (APPs -  Physician Assistants and Nurse Practitioners) who all work together to provide you with the care you need, when you need it.  We recommend signing up for the patient portal called  "MyChart".  Sign up information is provided on this After Visit Summary.  MyChart is used to connect with patients for Virtual Visits (Telemedicine).  Patients are able to view lab/test results, encounter notes, upcoming appointments, etc.  Non-urgent messages can be sent to your provider as well.   To learn more about what you can do with MyChart, go to [email or web address].    Your next appointment:   4 week(s)  Provider:   Norman Herrlich, MD    Other Instructions None

## 2023-03-17 ENCOUNTER — Telehealth: Payer: Self-pay | Admitting: *Deleted

## 2023-03-17 NOTE — Telephone Encounter (Signed)
Left message for pt to call us back about the monitor he wore. We got results for 1 day of data and need to know if company is sending pt another monitor or if he is willing to wear another one.

## 2023-03-26 ENCOUNTER — Encounter: Payer: Self-pay | Admitting: Cardiology

## 2023-03-26 NOTE — Progress Notes (Signed)
 " Cardiology Office Note:    Date:  03/28/2023   ID:  Micheal JONELLE Zachary Mickey., DOB [birth year], MRN [id number]  PCP:  Stephanie Charlene CROME, MD   [facility] Providers Cardiologist:  None     Referring MD: Stephanie Charlene CROME, MD   CC: follow up chest pain  History of Present Illness:    Micheal Vick. is a 57 y.o. 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  03/23/2017 stress test was negative for ischemia.  He establish care with Dr. Monetta on 02/28/2023 at the behest of his PCP for evaluation of chest pain.  His chief complaint was elevated heart rate, shortness of breath, chest discomfort.  He was instructed to stop his Zanaflex  and continue his beta-blocker.  A monitor was arranged which he wore for 1 day however it ultimately fell off.  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   He presents today accompanied by his mother for follow-up of chest pain.  States his symptoms are no worse and no better, continues to have chest pain with exertion, relieved with rest.  His heart rate remained elevated at 100 bpm today.  He wore a monitor but this eventually fell off after 1 day, however this revealed an average heart rate of 94 bpm, occasional SVE's 4.3%. He denies  dyspnea, pnd, orthopnea, n, v, dizziness, syncope, edema, weight gain, or early satiety.   Past Medical History:  Diagnosis Date   Abnormal serum immunoelectrophoresis 06/12/2017   Formatting of this note might be different from the original.  Eval with HemeOnc 12/2017, supposed to f/u in 3 months.  MGUS   Aortic root dilatation (HCC)    03/2023   Arthritis    Chewing tobacco use 05/18/2017   Chronic pain    Chronic pain of right knee 11/16/2017   Formatting of this note might be different from the original.  Patient reports  prior arthroscopy in ~2015 in [place] -- has had injections in past as well.     [facility] Radiology 05/2017 (under media tab):   Findings: There is no acute fracture or malalignment. No bone destruction or erosion. The visualized joint spaces are normal and there is no joint effusion. The periarticular soft tissues app   Chronic shortness of breath 09/18/2017   Chronic, continuous use of opioids 06/01/2017   Formatting of this note might be different from the original.  Refilled Oxycodone  10mg  #90 on 06/01/2017  Appt with Pain Mangemement   COPD (chronic obstructive pulmonary disease) (HCC)    Diabetes mellitus without complication (HCC)    Diverticulitis    Drug-induced constipation 03/05/2014   Elevated C-reactive protein 06/12/2017   Elevated erythrocyte sedimentation rate 06/12/2017   Esophageal reflux 02/14/2013   Essential tremor 07/06/2016   Family history of ischemic heart disease (IHD) 05/02/2013   Fatty liver    Hypertension    Hypertensive heart disease without heart failure 05/02/2013   Formatting of this note might be different from the original.  Diastolic dysfunction on TTE 07/2017   Mild intermittent asthma without complication 02/12/2020   Mood disorder due to a general medical condition 05/18/2017   Non-cardiac chest pain 01/20/2014   Formatting of this note might be different from the original.  Visit to [facility] ED  03/2017 with atypical chest pain. Negative EKG, negative enzymes x3.  Underwent stress test without ischemia, normal EF, normal CXR, negative CT angio for dissection or PE.  Risk factors: tobacco use, hypertension, untreated OSA     NM SPECT (03/24/2017)  Pharmacological myocardial perfusion imaging study with no signifi   Obesity hypoventilation syndrome (HCC) 05/27/2020   Formatting of this note might be different from the original.  per last BMI in vitals on 09/18/2017   Pain medication agreement signed 12/19/2017   Formatting of this note might be different from  the original.  [facility]ATMENT AGREEMENT; Read, reviewed and signed. Copy given to patient. Original to Medical Records. LJ/CMA  05/18/2020     Goal: play with grandson more.   Panic attacks 02/21/2018   Positive FIT (fecal immunochemical test) 07/31/2017   Formatting of this note might be different from the original.  Referred for colo 07/2017, 11/2017, 01/2018 -- patient has not scheduled   Primary osteoarthritis of right knee 02/14/2013   Risk for falls 08/22/2018   Type 2 diabetes mellitus with diabetic mononeuropathy, without long-term current use of insulin  (HCC) 05/19/2017   Formatting of this note is different from the original.     Date A1c   01/21/2014 5.4   05/18/2017 7.2   07/20/2017 8.4   09/11/2017 9.7 ([place])   10/30/2017 8.0    Past Surgical History:  Procedure Laterality Date   APPENDECTOMY     BACK SURGERY     CHOLECYSTECTOMY     NASAL SINUS SURGERY     SHOULDER SURGERY Right     Current Medications: Current Meds  Medication Sig   ACCU-CHEK AVIVA PLUS test strip USE AS INSTRUCTED TO CHECK BLOOD SUGAR TWICE DAILY   albuterol (PROVENTIL) (2.5 MG/3ML) 0.083% nebulizer solution Inhale 2.5 mg into the lungs as needed for wheezing or shortness of breath.   aspirin  EC 81 MG tablet Take 81 mg by mouth daily.   atorvastatin (LIPITOR) 20 MG tablet Take 20 mg by mouth daily.   buPROPion (WELLBUTRIN XL) 300 MG 24 hr tablet Take 300 mg by mouth daily.   Cholecalciferol (VITAMIN D3) 5000 units TABS Take 5,000 Units by mouth daily.   Docusate Sodium  (DSS) 100 MG CAPS Take 100 mg by mouth as needed (constipation).   FARXIGA 10 MG TABS tablet Take 10 mg by mouth daily.   fluticasone  (FLONASE ) 50 MCG/ACT nasal spray Place 2 sprays into both nostrils 2 (two) times daily.   GAVILAX 17 GM/SCOOP powder MIX 17G WITH JUICE OR WATER AND DRINK 2 TIMES A DAY   glimepiride (AMARYL) 4 MG tablet Take 4 mg by mouth in the morning and at bedtime.   GLYXAMBI 10-5 MG TABS TAKE  1 TABLET BY MOUTH DAILY FOR DIABETES   hydrOXYzine (ATARAX/VISTARIL) 25 MG tablet Take 25 mg by mouth as needed for anxiety.   LEVEMIR  FLEXTOUCH 100 UNIT/ML FlexPen START 22 UNITS AND TITRATE BY 2 UNITS TO MAX 36 UNITS   lisinopril -hydrochlorothiazide  (PRINZIDE ,ZESTORETIC ) 10-12.5 MG tablet Take 1 tablet by mouth daily.   meloxicam  (MOBIC ) 15 MG tablet Take 15 mg by mouth daily.   metFORMIN  (GLUCOPHAGE ) 1000 MG tablet Take 1 tablet (1,000 mg total) by mouth 2 (two) times daily with a meal.   metoprolol  succinate (TOPROL -XL) 100 MG 24 hr tablet Take 1 tablet (100 mg total) by mouth daily. Take with or immediately following a meal.   metoprolol  tartrate (LOPRESSOR ) 100 MG tablet Take 1 tablet (100 mg total)  by mouth once for 1 dose. Take 2 hours prior to CT   omeprazole (PRILOSEC) 40 MG capsule Take 40 mg by mouth daily.   ondansetron  (ZOFRAN  ODT) 4 MG disintegrating tablet Take 1 tablet (4 mg total) by mouth every 8 (eight) hours as needed for nausea or vomiting.   Oxycodone  HCl 10 MG TABS Take 1 tablet (10 mg total) by mouth every 6 (six) hours as needed (PAIN).   QUEtiapine  (SEROQUEL ) 100 MG tablet TAKE 1 TABLET BY MOUTH EVERY DAY AT NIGHT   rOPINIRole  (REQUIP ) 1 MG tablet TAKE 1 TABLET BY MOUTH EVERY DAY IN THE EVENING   tamsulosin  (FLOMAX ) 0.4 MG CAPS capsule Take 0.4 mg by mouth daily.   Tiotropium Bromide-Olodaterol (STIOLTO RESPIMAT) 2.5-2.5 MCG/ACT AERS Inhale 1 each into the lungs daily.   [DISCONTINUED] metoprolol  succinate (TOPROL -XL) 50 MG 24 hr tablet Take 1 tablet (50 mg total) by mouth daily.     Allergies:   Fluticasone -salmeterol, Gabapentin, Hydromorphone, Pregabalin, Tylenol  [acetaminophen ], Celecoxib, Codeine, Dulaglutide, and Ibuprofen   Social History   Socioeconomic History   Marital status: Single    Spouse name: Not on file   Number of children: Not on file   Years of education: Not on file   Highest education level: Not on file  Occupational History   Not on file   Tobacco Use   Smoking status: Former    Types: Cigars    Quit date: 07/06/2014    Years since quitting: 8.7   Smokeless tobacco: Current    Types: Snuff  Substance and Sexual Activity   Alcohol use: No    Comment: beer every 3 months   Drug use: No   Sexual activity: Not on file  Other Topics Concern   Not on file  Social History Narrative   He working on his disability.  He previously did holiday representative work, last in 2010.   He lives at home with fiance in a [place].   Highest level of education: 9th grade   Social Determinants of Health   Financial Resource Strain: Not on file  Food Insecurity: Not on file  Transportation Needs: Not on file  Physical Activity: Not on file  Stress: Not on file  Social Connections: Not on file     Family History: The patient's family history includes Cancer in his sister and sister; Diabetes in his mother; Heart disease in his father.  ROS:   Please see the history of present illness.     All other systems reviewed and are negative.  EKGs/Labs/Other Studies Reviewed:    The following studies were reviewed today: Cardiac Studies & Procedures     STRESS TESTS  NM MYOCAR MULTI W/SPECT W 03/24/2017  Narrative Pharmacological myocardial perfusion imaging study with no significant  ischemia Normal wall motion, EF estimated at 81% No EKG changes concerning for ischemia at peak stress or in recovery. Low risk scan   Signed, Velinda Lunger, MD, Ph.D [facility]   ECHOCARDIOGRAM  ECHOCARDIOGRAM COMPLETE 02/28/2023  Narrative ECHOCARDIOGRAM REPORT    Patient Name:   Micheal Tiegs. Date of Exam: 02/28/2023 Medical Rec #:  [id number]            Height:       72.0 in Accession #:    [id number]           Weight:       302.4 lb Date of Birth:  [birth year]  BSA:          2.540 m Patient Age:    57 years             BP:           128/80 mmHg Patient Gender: M                    HR:           103 bpm. Exam Location:   [facility]  Procedure: 2D Echo, Cardiac Doppler, Color Doppler and Strain Analysis  Indications:    Chest pain of uncertain etiology [R07.9 (ICD-10-CM)]; Palpitations [R00.2 (ICD-10-CM)]; Hypertensive heart disease without heart failure [I11.9 (ICD-10-CM)]; Type 2 diabetes mellitus with diabetic mononeuropathy, without long-term current use of insulin  (HCC) [E11.41 (ICD-10-CM)]; Hyperlipidemia, unspecified hyperlipidemia type [E78.5 (ICD-10-CM)]; Chronic obstructive pulmonary disease, unspecified COPD type (HCC) [J44.9 (ICD-10-CM)]  History:        Patient has prior history of Echocardiogram examinations, most recent 09/11/2017. COPD, Signs/Symptoms:Chest Pain; Risk Factors:Hypertension, Diabetes and Dyslipidemia.  Sonographer:    Charlie Jointer RDCS Referring Phys: [id number] [facility]   Sonographer Comments: Technically difficult study due to poor echo windows and suboptimal subcostal window. Image acquisition challenging due to patient body habitus. IMPRESSIONS   1. Left ventricular ejection fraction, by estimation, is 55 to 60%. The left ventricle has normal function. The left ventricle has no regional wall motion abnormalities. limited M mode left ventricular hypertrophy. Left ventricular diastolic parameters are consistent with Grade I diastolic dysfunction (impaired relaxation). 2. Right ventricular systolic function is normal. The right ventricular size is normal. 3. The mitral valve is normal in structure. No evidence of mitral valve regurgitation. No evidence of mitral stenosis. 4. The aortic valve is tricuspid. Aortic valve regurgitation is not visualized. No aortic stenosis is present. 5. Aortic DTA is NWV. There is moderate dilatation of the aortic root, measuring 40 mm.  FINDINGS Left Ventricle: Left ventricular ejection fraction, by estimation, is 55 to 60%. The left ventricle has normal function. The left ventricle has no regional wall motion abnormalities. Global  longitudinal strain performed but not reported based on interpreter judgement due to suboptimal tracking. The left ventricular internal cavity size was normal in size. Limited M mode left ventricular hypertrophy. Left ventricular diastolic parameters are consistent with Grade I diastolic dysfunction (impaired relaxation).  Right Ventricle: The right ventricular size is normal. No increase in right ventricular wall thickness. Right ventricular systolic function is normal.  Left Atrium: Left atrial size was normal in size.  Right Atrium: Right atrial size was normal in size.  Pericardium: There is no evidence of pericardial effusion.  Mitral Valve: The mitral valve is normal in structure. No evidence of mitral valve regurgitation. No evidence of mitral valve stenosis.  Tricuspid Valve: The tricuspid valve is normal in structure. Tricuspid valve regurgitation is not demonstrated. No evidence of tricuspid stenosis.  Aortic Valve: The aortic valve is tricuspid. Aortic valve regurgitation is not visualized. No aortic stenosis is present.  Pulmonic Valve: The pulmonic valve was not well visualized. Pulmonic valve regurgitation is not visualized. No evidence of pulmonic stenosis.  Aorta: The aortic arch was not well visualized and DTA is NWV. There is moderate dilatation of the aortic root, measuring 40 mm.  Venous: The pulmonary veins were not well visualized. The inferior vena cava was not well visualized.  IAS/Shunts: No atrial level shunt detected by color flow Doppler.   LEFT VENTRICLE PLAX 2D LVIDd:  5.10 cm   Diastology LVIDs:         3.50 cm   LV e' medial:    6.84 cm/s LV PW:         1.00 cm   LV E/e' medial:  9.0 LV IVS:        1.50 cm   LV e' lateral:   9.07 cm/s LVOT diam:     2.10 cm   LV E/e' lateral: 6.8 LVOT Area:     3.46 cm   RIGHT VENTRICLE RV Basal diam:  3.40 cm RV Mid diam:    3.00 cm RV S prime:     19.30 cm/s TAPSE (M-mode): 2.1 cm  LEFT ATRIUM            Index        RIGHT ATRIUM           Index LA diam:      3.80 cm 1.50 cm/m   RA Area:     20.00 cm LA Vol (A2C): 27.9 ml 10.99 ml/m  RA Volume:   50.90 ml  20.04 ml/m LA Vol (A4C): 74.8 ml 29.45 ml/m  AORTA Ao Root diam: 4.00 cm Ao Asc diam:  3.30 cm  MITRAL VALVE MV Area (PHT): 4.25 cm    SHUNTS MV Decel Time: 179 msec    Systemic Diam: 2.10 cm MV E velocity: 61.67 cm/s MV A velocity: 70.03 cm/s MV E/A ratio:  0.88  Redell Leiter MD Electronically signed by Redell Leiter MD Signature Date/Time: 02/28/2023/12:34:53 PM    Final                  Recent Labs: No results found for requested labs within last 365 days.  Recent Lipid Panel    Component Value Date/Time   CHOL 126 07/20/2020 0750   TRIG 190 (H) 07/20/2020 0750   HDL 46 07/20/2020 0750   CHOLHDL 2.7 07/20/2020 0750   VLDL 38 07/20/2020 0750   LDLCALC 42 07/20/2020 0750     Risk Assessment/Calculations:                Physical Exam:    VS:  BP 130/80 (BP Location: Right Arm, Patient Position: Sitting, Cuff Size: Normal)   Pulse 100   Ht 6' (1.829 m)   Wt (!) 301 lb (136.5 kg)   SpO2 90%   BMI 40.82 kg/m     Wt Readings from Last 3 Encounters:  03/28/23 (!) 301 lb (136.5 kg)  02/28/23 (!) 302 lb 6.4 oz (137.2 kg)  07/16/20 (!) 323 lb 9.6 oz (146.8 kg)     GEN: Obese, Well nourished, well developed in no acute distress HEENT: Normal NECK: No JVD; No carotid bruits LYMPHATICS: No lymphadenopathy CARDIAC: RRR, no murmurs, rubs, gallops RESPIRATORY:  Clear to auscultation without rales, wheezing or rhonchi  ABDOMEN: Soft, non-tender, non-distended MUSCULOSKELETAL:  No edema; No deformity  SKIN: Warm and dry NEUROLOGIC:  Alert and oriented x 3 PSYCHIATRIC:  flat  ASSESSMENT:    1. Chest pain of uncertain etiology   2. Palpitations   3. Essential hypertension   4. Type 2 diabetes mellitus with diabetic mononeuropathy, without long-term current use of insulin  (HCC)   5.  Hyperlipidemia, unspecified hyperlipidemia type   6. Ascending aorta dilatation (HCC)   7. Precordial pain    PLAN:    In order of problems listed above:  Chest pain of uncertain etiology-previously had a stress test that was negative for ischemia in 2018,  chest pain has mixed features however does appear to occur with exertion and is relieved with rest.  He does have nitroglycerin , he has taken this x 2 and noticed that it eased the pain off somewhat.  Discussed ED precautions.  He is very adverse to traveling to [facility], he would like to proceed with coronary CTA at [facility].  Continue aspirin  81 mg daily, continue metoprolol  succinate will increase to 100 mg daily, continue nitroglycerin  as needed, continue Lipitor 20 mg daily. Palpitations-wore a monitor for 1 day revealing occasional SVTs of 4.3%, will attempt a different monitor and try to wear for 7 days so we can see what the total burden is for that week.  Will increase his metoprolol  to 100 mg daily. Hypertension-blood pressure is marginally controlled at 130/80, continue Zestoretic  10-12.5 mg daily, continue metoprolol  100 mg daily. Hyperlipidemia-this appears to be managed by his PCP, most recent on file for review revealed an LDL of 42 and was well-controlled, continue Lipitor 20 mg daily. Ascending aorta dilatation-noted on echocardiogram at 40 mm, discussed CTA of the chest as well as ultrasound of the abdomen however for now would like to just do CTA for chest pain per above.  Will see if imaging is able to evaluate the aortic arch as well. DM2-managed by his PCP, he is on Farxiga, Amaryl, Levemir , metformin .   Disposition-coronary CTA for chest pain, CBC, BMET, 7-day monitor for palpitations, increase metoprolol  to 100 mg daily.       Medication Adjustments/Labs and Tests Ordered: Current medicines are reviewed at length with the patient today.  Concerns regarding medicines are outlined above.  Orders Placed  This Encounter  Procedures   CT CORONARY MORPH W/CTA COR W/SCORE W/CA W/CM &/OR WO/CM   Basic metabolic panel   CBC with Differential/Platelet   LONG TERM MONITOR (3-14 DAYS)   Meds ordered this encounter  Medications   metoprolol  tartrate (LOPRESSOR ) 100 MG tablet    Sig: Take 1 tablet (100 mg total) by mouth once for 1 dose. Take 2 hours prior to CT    Dispense:  1 tablet    Refill:  0   metoprolol  succinate (TOPROL -XL) 100 MG 24 hr tablet    Sig: Take 1 tablet (100 mg total) by mouth daily. Take with or immediately following a meal.    Dispense:  90 tablet    Refill:  3    Patient Instructions  Medication Instructions:  Your physician has recommended you make the following change in your medication:  Increase metoprolol  to 100 mg once daily  *If you need a refill on your cardiac medications before your next appointment, please call your pharmacy*   Lab Work: Your physician recommends that you return for lab work in: Today for BMP and CBC  If you have labs (blood work) drawn today and your tests are completely normal, you will receive your results only by: MyChart Message (if you have MyChart) OR A paper copy in the mail If you have any lab test that is abnormal or we need to change your treatment, we will call you to review the results.   Testing/Procedures: Your Cardiac CT will be scheduled at:  [facility] located off [facility] at the hospital.  Please arrive 30 minutes prior to your appointment time.  You can use the FREE valet parking offered at entrance to outpatient center (encouraged to control the heart rate for the test)   Please follow these instructions carefully (unless otherwise  directed):   Hold all erectile dysfunction medications at least 3 days (72 hrs) prior to test.   On the Night Before the Test:               Be sure to Drink plenty of water.               Do not consume any caffeinated/decaffeinated beverages or  chocolate 12 hours prior to your test.               Do not take any antihistamines 12 hours prior to your test.            On the Day of the Test:               Drink plenty of water until 1 hour prior to the test.               Do not eat any food 4 hours prior to the test.               No smoking 4 hours prior to test.               You may take your regular medications prior to the test.               Take metoprolol  (Lopressor ) two hours prior to test.               HOLD Furosemide/Hydrochlorothiazide  morning of the test.                          Wear plain shirt no beads, sparkles, rhinestones, metal or heavy embroidery.    After the Test:               Drink plenty of water.               After receiving IV contrast, you may experience a mild flushed feeling. This is normal.               On occasion, you may experience a mild rash up to 24 hours after the test. This is not dangerous. If this occurs, you can take Benadryl  25 mg and increase your fluid intake.               If you experience trouble breathing, this can be serious. If it is severe call 911 IMMEDIATELY. If it is mild, please call our office.               If you take any of these medications: Glipizide /Metformin , Avandament, Glucavance, please do not take 48 hours after completing test unless otherwise instructed.    We will call to schedule your test 2-4 weeks out understanding that some insurance companies will need an authorization prior to the service being performed.   Follow-Up: At [facility], you and your health needs are our priority.  As part of our continuing mission to provide you with exceptional heart care, we have created designated Provider Care Teams.  These Care Teams include your primary Cardiologist (physician) and Advanced Practice Providers (APPs -  Physician Assistants and Nurse Practitioners) who all work together to provide you with the care you need, when you need  it.  We recommend signing up for the patient portal called MyChart.  Sign up information is provided on this After Visit Summary.  MyChart is used to connect with patients for Virtual Visits (Telemedicine).  Patients are able to view lab/test results, encounter notes, upcoming appointments, etc.  Non-urgent messages can be sent to your provider as well.   To learn more about what you can do with MyChart, go to [email or web address].    Your next appointment:  To be determined after testing    Provider:   Redell Leiter, MD    Other Instructions Cardiac CT Angiogram A cardiac CT angiogram is a procedure to look at the heart and the area around the heart. It may be done to help find the cause of chest pains or other symptoms of heart disease. During this procedure, a substance called contrast dye is injected into the blood vessels in the area to be checked. A large X-ray machine, called a CT scanner, then takes detailed pictures of the heart and the surrounding area. The procedure is also sometimes called a coronary CT angiogram, coronary artery scanning, or CTA. A cardiac CT angiogram allows the health care provider to see how well blood is flowing to and from the heart. The health care provider will be able to see if there are any problems, such as: Blockage or narrowing of the coronary arteries in the heart. Fluid around the heart. Signs of weakness or disease in the muscles, valves, and tissues of the heart. Tell a health care provider about: Any allergies you have. This is especially important if you have had a previous allergic reaction to contrast dye. All medicines you are taking, including vitamins, herbs, eye drops, creams, and over-the-counter medicines. Any blood disorders you have. Any surgeries you have had. Any medical conditions you have. Whether you are pregnant or may be pregnant. Any anxiety disorders, chronic pain, or other conditions you have that may increase your  stress or prevent you from lying still. What are the risks? Generally, this is a safe procedure. However, problems may occur, including: Bleeding. Infection. Allergic reactions to medicines or dyes. Damage to other structures or organs. Kidney damage from the contrast dye that is used. Increased risk of cancer from radiation exposure. This risk is low. Talk with your health care provider about: The risks and benefits of testing. How you can receive the lowest dose of radiation. What happens before the procedure? Wear comfortable clothing and remove any jewelry, glasses, dentures, and hearing aids. Follow instructions from your health care provider about eating and drinking. This may include: For 12 hours before the procedure -- avoid caffeine. This includes tea, coffee, soda, energy drinks, and diet pills. Drink plenty of water or other fluids that do not have caffeine in them. Being well hydrated can prevent complications. For 4-6 hours before the procedure -- stop eating and drinking. The contrast dye can cause nausea, but this is less likely if your stomach is empty. Ask your health care provider about changing or stopping your regular medicines. This is especially important if you are taking diabetes medicines, blood thinners, or medicines to treat problems with erections (erectile dysfunction). What happens during the procedure?  Hair on your chest may need to be removed so that small sticky patches called electrodes can be placed on your chest. These will transmit information that helps to monitor your heart during the procedure. An IV will be inserted into one of your veins. You might be given a medicine to control your heart rate during the procedure. This will help to ensure that good images are obtained. You will be asked to lie on an exam table. This table will slide in and out of  the CT machine during the procedure. Contrast dye will be injected into the IV. You might feel warm, or  you may get a metallic taste in your mouth. You will be given a medicine called nitroglycerin . This will relax or dilate the arteries in your heart. The table that you are lying on will move into the CT machine tunnel for the scan. The person running the machine will give you instructions while the scans are being done. You may be asked to: Keep your arms above your head. Hold your breath. Stay very still, even if the table is moving. When the scanning is complete, you will be moved out of the machine. The IV will be removed. The procedure may vary among health care providers and hospitals. What can I expect after the procedure? After your procedure, it is common to have: A metallic taste in your mouth from the contrast dye. A feeling of warmth. A headache from the nitroglycerin . Follow these instructions at home: Take over-the-counter and prescription medicines only as told by your health care provider. If you are told, drink enough fluid to keep your urine pale yellow. This will help to flush the contrast dye out of your body. Most people can return to their normal activities right after the procedure. Ask your health care provider what activities are safe for you. It is up to you to get the results of your procedure. Ask your health care provider, or the department that is doing the procedure, when your results will be ready. Keep all follow-up visits as told by your health care provider. This is important. Contact a health care provider if: You have any symptoms of allergy to the contrast dye. These include: Shortness of breath. Rash or hives. A racing heartbeat. Summary A cardiac CT angiogram is a procedure to look at the heart and the area around the heart. It may be done to help find the cause of chest pains or other symptoms of heart disease. During this procedure, a large X-ray machine, called a CT scanner, takes detailed pictures of the heart and the surrounding area after a  contrast dye has been injected into blood vessels in the area. Ask your health care provider about changing or stopping your regular medicines before the procedure. This is especially important if you are taking diabetes medicines, blood thinners, or medicines to treat erectile dysfunction. If you are told, drink enough fluid to keep your urine pale yellow. This will help to flush the contrast dye out of your body. This information is not intended to replace advice given to you by your health care provider. Make sure you discuss any questions you have with your health care provider. Document Revised: 05/15/2019 Document Reviewed: 05/15/2019 Elsevier Patient Education  [address]      Signed, Delon JAYSON Hoover, NP  03/28/2023 8:59 AM    [facility] "

## 2023-03-28 ENCOUNTER — Ambulatory Visit: Payer: Medicaid Other | Attending: Cardiology | Admitting: Cardiology

## 2023-03-28 ENCOUNTER — Encounter: Payer: Self-pay | Admitting: Cardiology

## 2023-03-28 ENCOUNTER — Ambulatory Visit: Payer: Medicaid Other | Attending: Cardiology

## 2023-03-28 VITALS — BP 130/80 | HR 100 | Ht 72.0 in | Wt 301.0 lb

## 2023-03-28 DIAGNOSIS — R079 Chest pain, unspecified: Secondary | ICD-10-CM

## 2023-03-28 DIAGNOSIS — E1141 Type 2 diabetes mellitus with diabetic mononeuropathy: Secondary | ICD-10-CM | POA: Diagnosis not present

## 2023-03-28 DIAGNOSIS — R002 Palpitations: Secondary | ICD-10-CM | POA: Diagnosis not present

## 2023-03-28 DIAGNOSIS — R072 Precordial pain: Secondary | ICD-10-CM

## 2023-03-28 DIAGNOSIS — I10 Essential (primary) hypertension: Secondary | ICD-10-CM

## 2023-03-28 DIAGNOSIS — E785 Hyperlipidemia, unspecified: Secondary | ICD-10-CM

## 2023-03-28 DIAGNOSIS — Z7984 Long term (current) use of oral hypoglycemic drugs: Secondary | ICD-10-CM

## 2023-03-28 DIAGNOSIS — I77810 Thoracic aortic ectasia: Secondary | ICD-10-CM

## 2023-03-28 MED ORDER — METOPROLOL SUCCINATE ER 100 MG PO TB24: 100.0000 mg | ORAL_TABLET | Freq: Every day | ORAL | 3 refills | Status: DC

## 2023-03-28 MED ORDER — METOPROLOL TARTRATE 100 MG PO TABS: 100.0000 mg | ORAL_TABLET | Freq: Once | ORAL | 0 refills | Status: AC

## 2023-03-28 NOTE — Patient Instructions (Signed)
Medication Instructions:  Your physician has recommended you make the following change in your medication:  Increase metoprolol to 100 mg once daily  *If you need a refill on your cardiac medications before your next appointment, please call your pharmacy*   Lab Work: Your physician recommends that you return for lab work in: Today for BMP and CBC  If you have labs (blood work) drawn today and your tests are completely normal, you will receive your results only by: MyChart Message (if you have MyChart) OR A paper copy in the mail If you have any lab test that is abnormal or we need to change your treatment, we will call you to review the results.   Testing/Procedures: Your Cardiac CT will be scheduled at:  [facility] located off [facility] at the hospital.  Please arrive 30 minutes prior to your appointment time.  You can use the FREE valet parking offered at entrance to outpatient center (encouraged to control the heart rate for the test)   Please follow these instructions carefully (unless otherwise directed):   Hold all erectile dysfunction medications at least 3 days (72 hrs) prior to test.   On the Night Before the Test:               Be sure to Drink plenty of water.               Do not consume any caffeinated/decaffeinated beverages or chocolate 12 hours prior to your test.               Do not take any antihistamines 12 hours prior to your test.            On the Day of the Test:               Drink plenty of water until 1 hour prior to the test.               Do not eat any food 4 hours prior to the test.               No smoking 4 hours prior to test.               You may take your regular medications prior to the test.               Take metoprolol (Lopressor) two hours prior to test.               HOLD Furosemide/Hydrochlorothiazide morning of the test.                          Wear plain shirt no beads, sparkles,  rhinestones, metal or heavy embroidery.    After the Test:               Drink plenty of water.               After receiving IV contrast, you may experience a mild flushed feeling. This is normal.               On occasion, you may experience a mild rash up to 24 hours after the test. This is not dangerous. If this occurs, you can take Benadryl 25 mg and increase your fluid intake.               If you experience trouble breathing, this can be serious. If it is severe call  [phone] IMMEDIATELY. If it is mild, please call our office.               If you take any of these medications: Glipizide/Metformin, Avandament, Glucavance, please do not take 48 hours after completing test unless otherwise instructed.    We will call to schedule your test 2-4 weeks out understanding that some insurance companies will need an authorization prior to the service being performed.   Follow-Up: At [facility], you and your health needs are our priority.  As part of our continuing mission to provide you with exceptional heart care, we have created designated Provider Care Teams.  These Care Teams include your primary Cardiologist (physician) and Advanced Practice Providers (APPs -  Physician Assistants and Nurse Practitioners) who all work together to provide you with the care you need, when you need it.  We recommend signing up for the patient portal called "MyChart".  Sign up information is provided on this After Visit Summary.  MyChart is used to connect with patients for Virtual Visits (Telemedicine).  Patients are able to view lab/test results, encounter notes, upcoming appointments, etc.  Non-urgent messages can be sent to your provider as well.   To learn more about what you can do with MyChart, go to [email or web address].    Your next appointment:  To be determined after testing    Provider:   Norman Herrlich, MD    Other Instructions Cardiac CT Angiogram A cardiac CT angiogram is a  procedure to look at the heart and the area around the heart. It may be done to help find the cause of chest pains or other symptoms of heart disease. During this procedure, a substance called contrast dye is injected into the blood vessels in the area to be checked. A large X-ray machine, called a CT scanner, then takes detailed pictures of the heart and the surrounding area. The procedure is also sometimes called a coronary CT angiogram, coronary artery scanning, or CTA. A cardiac CT angiogram allows the health care provider to see how well blood is flowing to and from the heart. The health care provider will be able to see if there are any problems, such as: Blockage or narrowing of the coronary arteries in the heart. Fluid around the heart. Signs of weakness or disease in the muscles, valves, and tissues of the heart. Tell a health care provider about: Any allergies you have. This is especially important if you have had a previous allergic reaction to contrast dye. All medicines you are taking, including vitamins, herbs, eye drops, creams, and over-the-counter medicines. Any blood disorders you have. Any surgeries you have had. Any medical conditions you have. Whether you are pregnant or may be pregnant. Any anxiety disorders, chronic pain, or other conditions you have that may increase your stress or prevent you from lying still. What are the risks? Generally, this is a safe procedure. However, problems may occur, including: Bleeding. Infection. Allergic reactions to medicines or dyes. Damage to other structures or organs. Kidney damage from the contrast dye that is used. Increased risk of cancer from radiation exposure. This risk is low. Talk with your health care provider about: The risks and benefits of testing. How you can receive the lowest dose of radiation. What happens before the procedure? Wear comfortable clothing and remove any jewelry, glasses, dentures, and hearing  aids. Follow instructions from your health care provider about eating and drinking. This may include: For 12 hours before the procedure -- avoid caffeine.  This includes tea, coffee, soda, energy drinks, and diet pills. Drink plenty of water or other fluids that do not have caffeine in them. Being well hydrated can prevent complications. For 4-6 hours before the procedure -- stop eating and drinking. The contrast dye can cause nausea, but this is less likely if your stomach is empty. Ask your health care provider about changing or stopping your regular medicines. This is especially important if you are taking diabetes medicines, blood thinners, or medicines to treat problems with erections (erectile dysfunction). What happens during the procedure?  Hair on your chest may need to be removed so that small sticky patches called electrodes can be placed on your chest. These will transmit information that helps to monitor your heart during the procedure. An IV will be inserted into one of your veins. You might be given a medicine to control your heart rate during the procedure. This will help to ensure that good images are obtained. You will be asked to lie on an exam table. This table will slide in and out of the CT machine during the procedure. Contrast dye will be injected into the IV. You might feel warm, or you may get a metallic taste in your mouth. You will be given a medicine called nitroglycerin. This will relax or dilate the arteries in your heart. The table that you are lying on will move into the CT machine tunnel for the scan. The person running the machine will give you instructions while the scans are being done. You may be asked to: Keep your arms above your head. Hold your breath. Stay very still, even if the table is moving. When the scanning is complete, you will be moved out of the machine. The IV will be removed. The procedure may vary among health care providers and  hospitals. What can I expect after the procedure? After your procedure, it is common to have: A metallic taste in your mouth from the contrast dye. A feeling of warmth. A headache from the nitroglycerin. Follow these instructions at home: Take over-the-counter and prescription medicines only as told by your health care provider. If you are told, drink enough fluid to keep your urine pale yellow. This will help to flush the contrast dye out of your body. Most people can return to their normal activities right after the procedure. Ask your health care provider what activities are safe for you. It is up to you to get the results of your procedure. Ask your health care provider, or the department that is doing the procedure, when your results will be ready. Keep all follow-up visits as told by your health care provider. This is important. Contact a health care provider if: You have any symptoms of allergy to the contrast dye. These include: Shortness of breath. Rash or hives. A racing heartbeat. Summary A cardiac CT angiogram is a procedure to look at the heart and the area around the heart. It may be done to help find the cause of chest pains or other symptoms of heart disease. During this procedure, a large X-ray machine, called a CT scanner, takes detailed pictures of the heart and the surrounding area after a contrast dye has been injected into blood vessels in the area. Ask your health care provider about changing or stopping your regular medicines before the procedure. This is especially important if you are taking diabetes medicines, blood thinners, or medicines to treat erectile dysfunction. If you are told, drink enough fluid to keep your urine  pale yellow. This will help to flush the contrast dye out of your body. This information is not intended to replace advice given to you by your health care provider. Make sure you discuss any questions you have with your health care  provider. Document Revised: 05/15/2019 Document Reviewed: 05/15/2019 Elsevier Patient Education  2020 [employer].

## 2023-03-29 LAB — CBC WITH DIFFERENTIAL/PLATELET
Basophils Absolute: 0.1 x10E3/uL (ref 0.0–0.2)
Basos: 1 %
EOS (ABSOLUTE): 0.1 x10E3/uL (ref 0.0–0.4)
Eos: 1 %
Hematocrit: 54.3 % — ABNORMAL HIGH (ref 37.5–51.0)
Hemoglobin: 17.1 g/dL (ref 13.0–17.7)
Immature Grans (Abs): 0 x10E3/uL (ref 0.0–0.1)
Immature Granulocytes: 0 %
Lymphocytes Absolute: 1.8 x10E3/uL (ref 0.7–3.1)
Lymphs: 16 %
MCH: 27.6 pg (ref 26.6–33.0)
MCHC: 31.5 g/dL (ref 31.5–35.7)
MCV: 88 fL (ref 79–97)
Monocytes Absolute: 0.7 x10E3/uL (ref 0.1–0.9)
Monocytes: 6 %
Neutrophils Absolute: 8.2 x10E3/uL — ABNORMAL HIGH (ref 1.4–7.0)
Neutrophils: 76 %
Platelets: 375 x10E3/uL (ref 150–450)
RBC: 6.19 x10E6/uL — ABNORMAL HIGH (ref 4.14–5.80)
RDW: 15.8 % — ABNORMAL HIGH (ref 11.6–15.4)
WBC: 10.9 x10E3/uL — ABNORMAL HIGH (ref 3.4–10.8)

## 2023-03-29 LAB — BASIC METABOLIC PANEL WITH GFR
BUN/Creatinine Ratio: 13 (ref 9–20)
BUN: 12 mg/dL (ref 6–24)
CO2: 25 mmol/L (ref 20–29)
Calcium: 10.3 mg/dL — ABNORMAL HIGH (ref 8.7–10.2)
Chloride: 93 mmol/L — ABNORMAL LOW (ref 96–106)
Creatinine, Ser: 0.95 mg/dL (ref 0.76–1.27)
Glucose: 275 mg/dL — ABNORMAL HIGH (ref 70–99)
Potassium: 5.1 mmol/L (ref 3.5–5.2)
Sodium: 135 mmol/L (ref 134–144)
eGFR: 94 mL/min/1.73

## 2023-03-30 NOTE — Addendum Note (Signed)
Addended by: Roddie Mc on: 03/30/2023 02:17 PM   Modules accepted: Orders

## 2023-04-25 ENCOUNTER — Telehealth: Payer: Self-pay | Admitting: Cardiology

## 2023-04-25 NOTE — Telephone Encounter (Signed)
Patient is returning phone call in regards to heart monitor results. Please advise.

## 2023-04-25 NOTE — Telephone Encounter (Signed)
Results reviewed with pt as per Jennifer Woody PA's note.  Pt verbalized understanding and had no additional questions. Routed to PCP.  

## 2024-05-04 ENCOUNTER — Other Ambulatory Visit: Payer: Self-pay | Admitting: Cardiology
